# Patient Record
Sex: Male | Born: 1960
Health system: Southern US, Community
[De-identification: ages and names within clinical notes are randomized; demographics above are authoritative.]

## PROBLEM LIST (undated history)

## (undated) DIAGNOSIS — G4730 Sleep apnea, unspecified: Secondary | ICD-10-CM

## (undated) DIAGNOSIS — Z5189 Encounter for other specified aftercare: Secondary | ICD-10-CM

## (undated) DIAGNOSIS — H269 Unspecified cataract: Secondary | ICD-10-CM

## (undated) DIAGNOSIS — M1990 Unspecified osteoarthritis, unspecified site: Secondary | ICD-10-CM

## (undated) DIAGNOSIS — Z86010 Personal history of colonic polyps: Secondary | ICD-10-CM

## (undated) HISTORY — DX: Encounter for other specified aftercare: Z51.89

## (undated) HISTORY — DX: Sleep apnea, unspecified: G47.30

## (undated) HISTORY — DX: Personal history of colonic polyps: Z86.010

## (undated) HISTORY — DX: Unspecified osteoarthritis, unspecified site: M19.90

## (undated) HISTORY — PX: ANKLE FRACTURE SURGERY: SHX122

## (undated) HISTORY — PX: OTHER SURGICAL HISTORY: SHX169

## (undated) HISTORY — PX: CARPAL TUNNEL RELEASE: SHX101

## (undated) HISTORY — DX: Unspecified cataract: H26.9

## (undated) HISTORY — PX: COLONOSCOPY: SHX174

---

## 1999-04-20 ENCOUNTER — Inpatient Hospital Stay (HOSPITAL_COMMUNITY): Admission: EM | Admit: 1999-04-20 | Discharge: 1999-04-23 | Payer: Self-pay | Admitting: Emergency Medicine

## 1999-04-20 ENCOUNTER — Encounter: Payer: Self-pay | Admitting: Specialist

## 1999-04-20 ENCOUNTER — Encounter: Payer: Self-pay | Admitting: Emergency Medicine

## 1999-10-31 ENCOUNTER — Emergency Department (HOSPITAL_COMMUNITY): Admission: EM | Admit: 1999-10-31 | Discharge: 1999-10-31 | Payer: Self-pay | Admitting: Emergency Medicine

## 2000-03-24 ENCOUNTER — Emergency Department (HOSPITAL_COMMUNITY): Admission: EM | Admit: 2000-03-24 | Discharge: 2000-03-25 | Payer: Self-pay | Admitting: Emergency Medicine

## 2001-01-14 ENCOUNTER — Emergency Department (HOSPITAL_COMMUNITY): Admission: EM | Admit: 2001-01-14 | Discharge: 2001-01-14 | Payer: Self-pay | Admitting: Emergency Medicine

## 2001-01-27 ENCOUNTER — Ambulatory Visit (HOSPITAL_BASED_OUTPATIENT_CLINIC_OR_DEPARTMENT_OTHER): Admission: RE | Admit: 2001-01-27 | Discharge: 2001-01-27 | Payer: Self-pay | Admitting: Orthopedic Surgery

## 2003-06-13 ENCOUNTER — Emergency Department (HOSPITAL_COMMUNITY): Admission: EM | Admit: 2003-06-13 | Discharge: 2003-06-13 | Payer: Self-pay | Admitting: Emergency Medicine

## 2004-05-24 ENCOUNTER — Ambulatory Visit (HOSPITAL_BASED_OUTPATIENT_CLINIC_OR_DEPARTMENT_OTHER): Admission: RE | Admit: 2004-05-24 | Discharge: 2004-05-24 | Payer: Self-pay | Admitting: Otolaryngology

## 2004-10-27 ENCOUNTER — Ambulatory Visit: Payer: Self-pay | Admitting: Internal Medicine

## 2004-11-24 ENCOUNTER — Emergency Department (HOSPITAL_COMMUNITY): Admission: EM | Admit: 2004-11-24 | Discharge: 2004-11-25 | Payer: Self-pay | Admitting: Emergency Medicine

## 2005-10-12 ENCOUNTER — Emergency Department (HOSPITAL_COMMUNITY): Admission: EM | Admit: 2005-10-12 | Discharge: 2005-10-12 | Payer: Self-pay | Admitting: Emergency Medicine

## 2007-03-24 DIAGNOSIS — J45909 Unspecified asthma, uncomplicated: Secondary | ICD-10-CM | POA: Insufficient documentation

## 2007-03-24 DIAGNOSIS — M1990 Unspecified osteoarthritis, unspecified site: Secondary | ICD-10-CM | POA: Insufficient documentation

## 2007-03-24 DIAGNOSIS — F411 Generalized anxiety disorder: Secondary | ICD-10-CM | POA: Insufficient documentation

## 2009-01-01 ENCOUNTER — Ambulatory Visit: Payer: Self-pay | Admitting: Radiology

## 2009-01-01 ENCOUNTER — Emergency Department (HOSPITAL_BASED_OUTPATIENT_CLINIC_OR_DEPARTMENT_OTHER): Admission: EM | Admit: 2009-01-01 | Discharge: 2009-01-01 | Payer: Self-pay | Admitting: Emergency Medicine

## 2010-07-25 ENCOUNTER — Encounter (INDEPENDENT_AMBULATORY_CARE_PROVIDER_SITE_OTHER): Payer: Self-pay | Admitting: *Deleted

## 2010-07-26 ENCOUNTER — Encounter: Payer: Self-pay | Admitting: Internal Medicine

## 2010-08-03 NOTE — Letter (Signed)
Summary: [facility] Instructions  [facility]  [address] [place], [place] [address]   Phone: [phone]  Fax: [phone]       Calvin Lin    Jul 12, 1960    MRN: [id number]        Procedure Day Dorna Bloom: Wednesday, 09-13-10     Arrival Time:  10:30am     Procedure Time:  11:30am     Location of Procedure:                    _X _  [facility] (4th Floor)                        PREPARATION FOR COLONOSCOPY WITH MOVIPREP   Starting 5 days prior to your procedure 09-08-10  do not eat nuts, seeds, popcorn, corn, beans, peas,  salads, or any raw vegetables.  Do not take any fiber supplements (e.g. Metamucil, Citrucel, and Benefiber).  THE DAY BEFORE YOUR PROCEDURE         DATE: Tuesday, 09-12-10  1.  Drink clear liquids the entire day-NO SOLID FOOD  2.  Do not drink anything colored red or purple.  Avoid juices with pulp.  No orange juice.  3.  Drink at least 64 oz. (8 glasses) of fluid/clear liquids during the day to prevent dehydration and help the prep work efficiently.  CLEAR LIQUIDS INCLUDE: Water Jello Ice Popsicles Tea (sugar ok, no milk/cream) Powdered fruit flavored drinks Coffee (sugar ok, no milk/cream) Gatorade Juice: apple, white grape, white cranberry  Lemonade Clear bullion, consomm, broth Carbonated beverages (any kind) Strained chicken noodle soup Hard Candy                             4.  In the morning, mix first dose of MoviPrep solution:    Empty 1 Pouch A and 1 Pouch B into the disposable container    Add lukewarm drinking water to the top line of the container. Mix to dissolve    Refrigerate (mixed solution should be used within 24 hrs)  5.  Begin drinking the prep at 5:00 p.m. The MoviPrep container is divided by 4 marks.   Every 15 minutes drink the solution down to the next mark (approximately 8 oz) until the full liter is complete.   6.  Follow completed prep with 16 oz of clear liquid of your choice  (Nothing red or purple).  Continue to drink clear liquids until bedtime.  7.  Before going to bed, mix second dose of MoviPrep solution:    Empty 1 Pouch A and 1 Pouch B into the disposable container    Add lukewarm drinking water to the top line of the container. Mix to dissolve    Refrigerate  THE DAY OF YOUR PROCEDURE      DATE: Wednesday, 09-13-10  Beginning at 5:30 a.m. (5 hours before procedure):         1. Every 15 minutes, drink the solution down to the next mark (approx 8 oz) until the full liter is complete.  2. Follow completed prep with 16 oz. of clear liquid of your choice.    3. You may drink clear liquids until 9:30am (2 HOURS BEFORE PROCEDURE).   MEDICATION INSTRUCTIONS  Unless otherwise instructed, you should take regular prescription medications with a small sip of water   as early as possible the morning of your procedure.  OTHER INSTRUCTIONS  You will need a responsible adult at least 52 years of age to accompany you and drive you home.   This person must remain in the waiting room during your procedure.  Wear loose fitting clothing that is easily removed.  Leave jewelry and other valuables at home.  However, you may wish to bring a book to read or  an iPod/MP3 player to listen to music as you wait for your procedure to start.  Remove all body piercing jewelry and leave at home.  Total time from sign-in until discharge is approximately 2-3 hours.  You should go home directly after your procedure and rest.  You can resume normal activities the  day after your procedure.  The day of your procedure you should not:   Drive   Make legal decisions   Operate machinery   Drink alcohol   Return to work  You will receive specific instructions about eating, activities and medications before you leave.    The above instructions have been reviewed and explained to me by   Karl Bales RN  July 26, 2010 9:26 AM    I fully  understand and can verbalize these instructions _____________________________ Date _________

## 2010-08-03 NOTE — Miscellaneous (Signed)
Summary: [facility] previsit  Clinical Lists Changes  Medications: Added new medication of MOVIPREP 100 GM  SOLR (PEG-KCL-NACL-NASULF-NA ASC-C) As per prep instructions. - Signed Rx of MOVIPREP 100 GM  SOLR (PEG-KCL-NACL-NASULF-NA ASC-C) As per prep instructions.;  #1 x 0;  Signed;  Entered by: Karl Bales RN;  Authorized by: Iva Boop MD, [facility];  Method used: Electronically to [id number]*, [address] [place], [place], [place]  [address], Ph: [phone], Fax: [phone] Observations: Added new observation of NKA: T (07/26/2010 8:53)    Prescriptions: MOVIPREP 100 GM  SOLR (PEG-KCL-NACL-NASULF-NA ASC-C) As per prep instructions.  #1 x 0   Entered by:   Karl Bales RN   Authorized by:   Iva Boop MD, [facility]   Signed by:   Karl Bales RN on 07/26/2010   Method used:   Electronically to        [facility] [phone]* (retail)       [phone]       [place], [place]  [address]       Ph: [phone]       Fax: [phone]   RxID:   [id number]

## 2010-08-09 ENCOUNTER — Other Ambulatory Visit: Payer: Self-pay | Admitting: Internal Medicine

## 2010-08-22 ENCOUNTER — Other Ambulatory Visit: Payer: Self-pay | Admitting: Internal Medicine

## 2010-09-12 ENCOUNTER — Encounter: Payer: Self-pay | Admitting: Internal Medicine

## 2010-09-13 ENCOUNTER — Ambulatory Visit (AMBULATORY_SURGERY_CENTER): Payer: 59 | Admitting: Internal Medicine

## 2010-09-13 ENCOUNTER — Encounter: Payer: Self-pay | Admitting: Internal Medicine

## 2010-09-13 VITALS — BP 116/70 | HR 78 | Temp 98.1°F | Resp 16 | Ht 71.0 in | Wt 240.0 lb

## 2010-09-13 DIAGNOSIS — Z860100 Personal history of colon polyps, unspecified: Secondary | ICD-10-CM | POA: Insufficient documentation

## 2010-09-13 DIAGNOSIS — Z1211 Encounter for screening for malignant neoplasm of colon: Secondary | ICD-10-CM

## 2010-09-13 DIAGNOSIS — D126 Benign neoplasm of colon, unspecified: Secondary | ICD-10-CM

## 2010-09-13 HISTORY — DX: Personal history of colon polyps, unspecified: Z86.0100

## 2010-09-13 HISTORY — DX: Personal history of colonic polyps: Z86.010

## 2010-09-13 NOTE — Progress Notes (Signed)
Pressure applied to abdomen to reach cecum.  

## 2010-09-13 NOTE — Patient Instructions (Signed)
HANDOUT GIVEN ON POLYPS WITH DISCHARGE INSTRUCTIONS

## 2010-09-14 ENCOUNTER — Telehealth: Payer: Self-pay

## 2010-09-14 NOTE — Telephone Encounter (Signed)
Home # has been disiconnected.   Tried to reach pt. By cell #.  Voice mail but no ID.  No message left. MAW,RN

## 2010-09-18 ENCOUNTER — Encounter: Payer: Self-pay | Admitting: Internal Medicine

## 2010-09-18 NOTE — Progress Notes (Signed)
Quick Note:  3 adenomas (diminutive) Recall colon 3 years 08/2013 ______

## 2010-09-19 NOTE — Procedures (Signed)
Summary: Colonoscopy  Patient: Calvin Lin Note: All result statuses are Final unless otherwise noted.  Tests: (1) Colonoscopy (COL)   COL Colonoscopy           DONE     [facility]     [address]     [place], [place]  [address]          COLONOSCOPY PROCEDURE REPORT          PATIENT:  Calvin Lin, Calvin Lin  MR#:  [id number]     BIRTHDATE:  08-06-1960, 50 yrs. old  GENDER:  male     ENDOSCOPIST:  Iva Boop, MD, [facility]     REF. BY:  Lissa Merlin, M.D.     PROCEDURE DATE:  09/13/2010     PROCEDURE:  Colonoscopy with snare polypectomy     ASA CLASS:  Class I     INDICATIONS:  Routine Risk Screening     MEDICATIONS:   Fentanyl 75 mcg IV, Versed 7 mg IV          DESCRIPTION OF PROCEDURE:   After the risks benefits and     alternatives of the procedure were thoroughly explained, informed     consent was obtained.  Digital rectal exam was performed and     revealed no abnormalities and normal prostate.   The LB160 [id number]     endoscope was introduced through the anus and advanced to the     cecum, which was identified by both the appendix and ileocecal     valve, without limitations.  The quality of the prep was good,     using MoviPrep.  The instrument was then slowly withdrawn as the     colon was fully examined. Insertion: 3:13 minutes withdrawal:     12:57 minutes     <<PROCEDUREIMAGES>>          FINDINGS:  Three polyps were found. IC valve (4-5 mm), transverse     (5mm) and sigmoid (3mm). Polyps were snared without cautery.     Retrieval was successful.  This was otherwise a normal examination     of the colon.   Retroflexed views in the rectum revealed no     abnormalities.    The scope was then withdrawn from the patient     and the procedure completed.          COMPLICATIONS:  None     ENDOSCOPIC IMPRESSION:     1) Three polyps removed, largest 5mm     2) Otherwise normal examination, good prep          REPEAT EXAM:  In for Colonoscopy, pending  biopsy results.          Iva Boop, MD, [facility]          CC:  Lissa Merlin, MD ([facility]) and The Patient          n.     eSIGNED:   Iva Boop at 09/13/2010 12:02 PM          [place], [phone]  Note: An exclamation mark (!) indicates a result that was not dispersed into the flowsheet. Document Creation Date: 09/13/2010 12:02 PM _______________________________________________________________________  (1) Order result status: Final Collection or observation date-time: 09/13/2010 11:54 Requested date-time:  Receipt date-time:  Reported date-time:  Referring Physician:   Ordering Physician: Stan Head [phone]) Specimen Source:  Source: Launa Grill Order Number: [id number] Lab site:

## 2010-09-24 LAB — DIFFERENTIAL
Basophils Absolute: 0.1 10*3/uL (ref 0.0–0.1)
Basophils Relative: 1 % (ref 0–1)
Eosinophils Absolute: 0.2 10*3/uL (ref 0.0–0.7)
Eosinophils Relative: 4 % (ref 0–5)
Lymphocytes Relative: 32 % (ref 12–46)
Lymphs Abs: 1.9 10*3/uL (ref 0.7–4.0)
Monocytes Absolute: 0.5 10*3/uL (ref 0.1–1.0)
Monocytes Relative: 9 % (ref 3–12)
Neutro Abs: 3.2 10*3/uL (ref 1.7–7.7)
Neutrophils Relative %: 55 % (ref 43–77)

## 2010-09-24 LAB — BASIC METABOLIC PANEL
BUN: 13 mg/dL (ref 6–23)
CO2: 28 mEq/L (ref 19–32)
Calcium: 9.3 mg/dL (ref 8.4–10.5)
Chloride: 104 mEq/L (ref 96–112)
Creatinine, Ser: 0.9 mg/dL (ref 0.4–1.5)
GFR calc Af Amer: >60 mL/min (ref >60)
GFR calc non Af Amer: >60 mL/min (ref >60)
Glucose, Bld: 88 mg/dL (ref 70–99)
Potassium: 4.3 mEq/L (ref 3.5–5.1)
Sodium: 142 mEq/L (ref 135–145)

## 2010-09-24 LAB — CBC
HCT: 40.2 % (ref 39.0–52.0)
Hemoglobin: 13.9 g/dL (ref 13.0–17.0)
MCHC: 34.5 g/dL (ref 30.0–36.0)
MCV: 88.6 fL (ref 78.0–100.0)
Platelets: 217 10*3/uL (ref 150–400)
RBC: 4.54 MIL/uL (ref 4.22–5.81)
RDW: 12.2 % (ref 11.5–15.5)
WBC: 5.9 10*3/uL (ref 4.0–10.5)

## 2010-09-24 LAB — POCT CARDIAC MARKERS
CKMB, poc: 3.4 ng/mL (ref 1.0–8.0)
Myoglobin, poc: 66.5 ng/mL (ref 12–200)
Troponin i, poc: <0.05 ng/mL (ref 0.00–0.09)

## 2010-11-03 NOTE — Procedures (Signed)
Calvin Lin, Calvin Lin              ACCOUNT NO.:  [id number]   MEDICAL RECORD NO.:  [id number]          PATIENT TYPE:  OUT   LOCATION:  SLEEP CENTER                 FACILITY:  [facility]   PHYSICIAN:  Clinton D. Maple Hudson, M.D. DATE OF BIRTH:  May 19, 1961   DATE OF STUDY:  05/24/2004                              NOCTURNAL POLYSOMNOGRAM   STUDY DATE:  05/24/04   REFERRING PHYSICIAN:  Cristal Deer E. Ezzard Standing, M.D.   INDICATION FOR STUDY:  Hypersomnia with sleep apnea.   EPWORTH SLEEPINESS SCORE:  14/24   NECK SIZE:  17 inches   BMI:  32.6   WEIGHT:  228 pounds   SLEEP ARCHITECTURE:  Total sleep time 396 minutes with sleep efficiency 87%.  Stage I was 3%, stage II 75%, stages III and IV 6%.  REM was 17% of total  sleep time.  Sleep latency 5 minutes.  REM latency 127 minutes.  Awake after  sleep onset 53 minutes.  Arousal index 16.   RESPIRATORY DATA:  Split study protocol.  RDI 21.2 per hour indicating  moderate obstructive sleep apnea/hypopnea syndrome before CPAP.  This  included 1 obstructive apnea and 55 hypopnea's before CPAP.  Most events and  most sleep were while supine.  REM RDI 17.4 per hour.  CPAP was titrated to  11 CWP, RDI 0 per hour.  A large ResMed full face mask was used with heated  humerus.   OXYGEN DATA:  Moderate to loud snoring with oxygen desaturation to a nadir  of 85% before CPAP.  After CPAP control, saturation held 95% to 96% on room  air.   CARDIAC DATA:  Normal cardiac rhythm.   MOVEMENTS/PARASOMNIA:  A total of 169 limb jerks were recorded of which 9  were associated with arousal or awakening for a periodic limb movement with  arousal index of 1.4 per hour which is probably not significant.   IMPRESSION/RECOMMENDATION:  Moderately severe obstructive sleep  apnea/hypopnea syndrome, RDI 21.2 per hour with desaturation to 85%.  CPAP  was titrated to 11 CWP, RDI 0 per hour using a large ResMed full face mask  with heated humidifier.                                        Clinton D. Maple Hudson, M.D.  Diplomate, [employer] Board   CDY/MEDQ  D:  05/28/2004 12:26:05  T:  05/28/2004 19:26:37  Job:  [id number]

## 2010-11-03 NOTE — Op Note (Signed)
[place]  Patient:    Calvin, Lin                       MRN: [id number] Proc. Date: 01/27/01 Adm. 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  OPERATION/PROCEDURE:  1. Right ankle examination under anesthesia followed by arthroscopic     debridement.  2. Partial synovectomy.  3. Loose body excision.  SURGEON: Elana Alm. Thurston Hole, M.D.  ASSISTANT: Julien Girt, P.A.  ANESTHESIA: General.  OPERATIVE TIME: Forty-five minutes.  COMPLICATIONS: None.  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  Of note, he had a previous ankle fracture, fixed by Dr. Valma Cava in 2000, and had done fairly well until this most recent injury in April 2002.  X-ray examination and MRI have revealed loose body calcification in the medial deltoid ligament and chondromalacia, and possible partial nonunion versus delayed union of his posterior malleolus, although all of his pain is anterior and anteromedial.  DESCRIPTION OF PROCEDURE: Calvin Lin was brought to the operating room on January 27, 2001 and placed on the operating room table in the supine position. After an adequate level of general anesthesia was obtained his right ankle was examined under anesthesia.  Range of motion, dorsiflexion 5 degrees, plantar flexion 20 degrees.  The ankle was stable to ligamentous examination.  An anterolateral portal and anteromedial portal were made carefully, incising only the skin, and carefully dissecting down to the joint to prevent any injury to the dorsal  cutaneous nerves.  The arthroscope with a pump attached with placed in the anteromedial portal and an arthroscopic probe placed through the anterolateral portal.  On initial inspection grade 3 chondromalacia was noted over 25-30% of the talar dome and tibial plafond, which was thoroughly debrided.  Significant synovitis and scarring anterolaterally was thoroughly debrided.  The lateral malleolus showed grade 2 and 25-30% grade 3 chondromalacia, which was debrided from the lateral talus. Articulating with this showed mild grade 1-2 changes.  The lateral gutter was debrided of synovitis but no other pathology was noted.  Medially moderate synovitis was debrided.  The medial gutters showed a partially loose calcific piece in the deep portion of the medial deltoid ligament, which was teased out and removed in pieces.  This was removed both arthroscopically and confirmed fluoroscopically.  The articular surface on the medial malleolus showed grade 2 and 25-30% grade 3 chondromalacia, which was debrided.  The medial talar dome had the same findings and this was debrided.  After this was done, fluoroscopically there was still a hint of some calcification in the mid portion of the deltoid ligament; however, it was felt that this faint remainder of calcium did not need to be removed due to the fact that it would probably cause more damage to the deltoid ligament removing it in that the main portions of the medial calcification had been thoroughly removed, which was causing his pain.  After this was done, no other pathology was noted.  At this point it was  felt that all pathology had been satisfactorily addressed. The instruments were removed.  Portals were closed with 3-0 nylon suture and injected with 0.25% Marcaine with epinephrine, sterile dressings were applied, and the tourniquet was released.  The patient was awakened and taken to the recovery room in stable condition.  FOLLOW-UP CARE: [name] will be followed as an outpatient, on Vicodin and Naprosyn, and is to see me back in the office in a week for sutures and follow-up. DD:  01/27/01 TD:  01/27/01 Job: [id number]

## 2010-12-08 ENCOUNTER — Emergency Department (HOSPITAL_COMMUNITY): Payer: 59

## 2010-12-08 ENCOUNTER — Emergency Department (HOSPITAL_COMMUNITY)
Admission: EM | Admit: 2010-12-08 | Discharge: 2010-12-08 | Disposition: A | Discharge status: Home / Self Care | Payer: 59 | Attending: Emergency Medicine | Admitting: Emergency Medicine

## 2010-12-08 DIAGNOSIS — W268XXA Contact with other sharp object(s), not elsewhere classified, initial encounter: Secondary | ICD-10-CM | POA: Insufficient documentation

## 2010-12-08 DIAGNOSIS — M79609 Pain in unspecified limb: Secondary | ICD-10-CM | POA: Insufficient documentation

## 2010-12-08 DIAGNOSIS — Y93H2 Activity, gardening and landscaping: Secondary | ICD-10-CM | POA: Insufficient documentation

## 2010-12-08 DIAGNOSIS — S61209A Unspecified open wound of unspecified finger without damage to nail, initial encounter: Secondary | ICD-10-CM | POA: Insufficient documentation

## 2011-10-29 ENCOUNTER — Encounter (HOSPITAL_COMMUNITY): Payer: Self-pay

## 2011-10-29 ENCOUNTER — Emergency Department (HOSPITAL_COMMUNITY): Payer: 59

## 2011-10-29 ENCOUNTER — Emergency Department (HOSPITAL_COMMUNITY)
Admission: EM | Admit: 2011-10-29 | Discharge: 2011-10-29 | Disposition: A | Discharge status: Home / Self Care | Payer: 59 | Attending: Emergency Medicine | Admitting: Emergency Medicine

## 2011-10-29 DIAGNOSIS — M542 Cervicalgia: Secondary | ICD-10-CM | POA: Insufficient documentation

## 2011-10-29 DIAGNOSIS — M25539 Pain in unspecified wrist: Secondary | ICD-10-CM | POA: Insufficient documentation

## 2011-10-29 DIAGNOSIS — S060X9A Concussion with loss of consciousness of unspecified duration, initial encounter: Secondary | ICD-10-CM | POA: Insufficient documentation

## 2011-10-29 DIAGNOSIS — G4730 Sleep apnea, unspecified: Secondary | ICD-10-CM | POA: Insufficient documentation

## 2011-10-29 DIAGNOSIS — Z8739 Personal history of other diseases of the musculoskeletal system and connective tissue: Secondary | ICD-10-CM | POA: Insufficient documentation

## 2011-10-29 DIAGNOSIS — J45909 Unspecified asthma, uncomplicated: Secondary | ICD-10-CM | POA: Insufficient documentation

## 2011-10-29 DIAGNOSIS — S060XAA Concussion with loss of consciousness status unknown, initial encounter: Secondary | ICD-10-CM

## 2011-10-29 DIAGNOSIS — S63509A Unspecified sprain of unspecified wrist, initial encounter: Secondary | ICD-10-CM

## 2011-10-29 MED ORDER — MORPHINE SULFATE 4 MG/ML IJ SOLN
4.0000 mg | Freq: Once | INTRAMUSCULAR | Status: AC
  Administered 2011-10-29: 4 mg via INTRAMUSCULAR
  Filled 2011-10-29: qty 4

## 2011-10-29 MED ORDER — METHOCARBAMOL 500 MG PO TABS: 500.0000 mg | ORAL_TABLET | Freq: Two times a day (BID) | ORAL | Status: AC

## 2011-10-29 MED ORDER — ONDANSETRON 4 MG PO TBDP
4.0000 mg | ORAL_TABLET | Freq: Once | ORAL | Status: AC
  Administered 2011-10-29: 4 mg via ORAL
  Filled 2011-10-29: qty 1

## 2011-10-29 MED ORDER — ONDANSETRON HCL 4 MG PO TABS: 4.0000 mg | ORAL_TABLET | Freq: Four times a day (QID) | ORAL | Status: AC

## 2011-10-29 MED ORDER — HYDROCODONE-ACETAMINOPHEN 5-325 MG PO TABS: 1.0000 | ORAL_TABLET | ORAL | Status: AC | PRN

## 2011-10-29 MED ORDER — IBUPROFEN 600 MG PO TABS: 600.0000 mg | ORAL_TABLET | Freq: Four times a day (QID) | ORAL | Status: AC | PRN

## 2011-10-29 NOTE — Discharge Instructions (Signed)
Concussion Direct trauma to the head often causes a condition known as a concussion. This injury will interfere with brain function and may cause you to lose consciousness. The consequences of a concussion are usually temporary, but repetitive concussions can be very dangerous. If you have multiple concussions, you will have a greater risk of long-term effects, such as slurred speech, slow movements, impaired thinking, or tremors. The severity of a concussion is based on the length and severity of the interference with brain activity. SYMPTOMS  Symptoms of a concussion vary depending on the severity of the injury. Very mild concussions may even occur without any noticeable symptoms. Swelling in the area of the injury is not related to the seriousness of the injury.   Mild concussion:   Temporary loss of consciousness.   Memory loss (amnesia) for a short time.   Emotional instability.   Confusion.   Severe concussion:   Usually prolonged loss of consciousness.   One pupil (the black part in the middle of the eye) is larger than the other.   Changes in vision (including blurring).   Changes in breathing.   Disturbed balance (equilibrium).   Headaches.   Confusion.   Nausea or vomiting.  CAUSES  A concussion is the result of trauma to the head. When the head is subjected to such an injury, the brain strikes against the inner wall of the skull. This impact is what causes the damage to the brain. The force of injury is related to severity of injury. The most severe concussions are associated with incidents that involve large impact forces such as motor vehicle accidents. Wearing a helmet will reduce the severity of trauma to the head, but concussions may still occur if you are wearing a helmet. RISK INCREASES WITH:  Contact sports (football, hockey, rugby, or lacrosse).   Fighting sports (martial arts or boxing).   Riding bicycles, motorcycles, or horses (when you ride without a  helmet).  PREVENTION  Wear proper protective headgear and ensure correct fit.   Wear seat belts when driving and riding in a car.   Do not drink or use mind-altering drugs and drive.  PROGNOSIS  Concussions are typically curable if they are recognized and treated early. If a severe concussion or multiple concussions go untreated, then the complications may be life-threatening or cause permanent disability and brain damage. RELATED COMPLICATIONS   Permanent brain damage (slurred speech, slow movement, impaired thinking, or tremors).   Bleeding under the skull (subdural hemorrhage or hematoma, epidural hematoma).   Bleeding into the brain.   Prolonged healing time if usual activities are resumed too soon.   Infection if skin over the concussion site is broken.   Increased risk of future concussions (less trauma is required for a second concussion than the first).  TREATMENT  Treatment initially requires immediate evaluation to determine the severity of the concussion. Occasionally, a hospital stay may be required for observation and treatment.  Avoid exertion. Bed rest for the first 24 to 48 hours is recommended.  Return to play is a controversial subject due to the increased risk for future injury as well as permanent disability and should be discussed at length with your treating caregiver. Many factors such as the severity of the concussion and whether this is the first, second, or third concussion play a role in timing a patient's return to sports.  MEDICATION  Do not give any medicine, including non-prescription acetaminophen or aspirin, until the diagnosis is certain. These medicines may mask   developing symptoms.  SEEK IMMEDIATE MEDICAL CARE IF:   Symptoms get worse or do not improve in 24 hours.   Any of the following symptoms occur:   Vomiting.   The inability to move arms and legs equally well on both sides.   Fever.   Neck stiffness.   Pupils of unequal size, shape,  or reactivity.   Convulsions.   Noticeable restlessness.   Severe headache that persists for longer than 4 hours after injury.   Confusion, disorientation, or mental status changes.  Document Released: 06/04/2005 Document Revised: 05/24/2011 Document Reviewed: 09/16/2008 Exit[employer] Patient Information 2012 [employer]. 
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
## 2012-04-21 ENCOUNTER — Encounter (HOSPITAL_COMMUNITY): Payer: Self-pay | Admitting: *Deleted

## 2012-04-21 ENCOUNTER — Emergency Department (HOSPITAL_COMMUNITY)
Admission: EM | Admit: 2012-04-21 | Discharge: 2012-04-21 | Disposition: A | Discharge status: Home / Self Care | Payer: Worker's Compensation | Attending: Emergency Medicine | Admitting: Emergency Medicine

## 2012-04-21 DIAGNOSIS — Y9289 Other specified places as the place of occurrence of the external cause: Secondary | ICD-10-CM | POA: Insufficient documentation

## 2012-04-21 DIAGNOSIS — Y9389 Activity, other specified: Secondary | ICD-10-CM | POA: Insufficient documentation

## 2012-04-21 DIAGNOSIS — Z8739 Personal history of other diseases of the musculoskeletal system and connective tissue: Secondary | ICD-10-CM | POA: Insufficient documentation

## 2012-04-21 DIAGNOSIS — T65891A Toxic effect of other specified substances, accidental (unintentional), initial encounter: Secondary | ICD-10-CM | POA: Insufficient documentation

## 2012-04-21 DIAGNOSIS — T5994XA Toxic effect of unspecified gases, fumes and vapors, undetermined, initial encounter: Secondary | ICD-10-CM

## 2012-04-21 DIAGNOSIS — J45909 Unspecified asthma, uncomplicated: Secondary | ICD-10-CM | POA: Insufficient documentation

## 2012-04-21 DIAGNOSIS — G4730 Sleep apnea, unspecified: Secondary | ICD-10-CM | POA: Insufficient documentation
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
## 2012-04-24 NOTE — ED Provider Notes (Signed)
Medical screening examination/treatment/procedure(s) were performed by non-physician practitioner and as supervising physician I was immediately available for consultation/collaboration.   David H Yao, MD 04/24/12 0657 

## 2013-08-19 ENCOUNTER — Encounter: Payer: Self-pay | Admitting: Internal Medicine

## 2013-09-16 ENCOUNTER — Encounter: Payer: Self-pay | Admitting: Internal Medicine

## 2013-10-12 ENCOUNTER — Encounter (HOSPITAL_COMMUNITY): Payer: Self-pay | Admitting: Emergency Medicine

## 2013-10-12 ENCOUNTER — Emergency Department (HOSPITAL_COMMUNITY): Payer: Worker's Compensation

## 2013-10-12 ENCOUNTER — Emergency Department (HOSPITAL_COMMUNITY)
Admission: EM | Admit: 2013-10-12 | Discharge: 2013-10-12 | Disposition: A | Discharge status: Home / Self Care | Payer: Worker's Compensation | Attending: Emergency Medicine | Admitting: Emergency Medicine

## 2013-10-12 DIAGNOSIS — S59909A Unspecified injury of unspecified elbow, initial encounter: Secondary | ICD-10-CM | POA: Diagnosis present

## 2013-10-12 DIAGNOSIS — Z8739 Personal history of other diseases of the musculoskeletal system and connective tissue: Secondary | ICD-10-CM | POA: Diagnosis not present

## 2013-10-12 DIAGNOSIS — IMO0002 Reserved for concepts with insufficient information to code with codable children: Secondary | ICD-10-CM | POA: Diagnosis not present

## 2013-10-12 DIAGNOSIS — S59919A Unspecified injury of unspecified forearm, initial encounter: Secondary | ICD-10-CM | POA: Diagnosis present

## 2013-10-12 DIAGNOSIS — S59911A Unspecified injury of right forearm, initial encounter: Secondary | ICD-10-CM

## 2013-10-12 DIAGNOSIS — Y9389 Activity, other specified: Secondary | ICD-10-CM | POA: Diagnosis not present

## 2013-10-12 DIAGNOSIS — J45909 Unspecified asthma, uncomplicated: Secondary | ICD-10-CM | POA: Insufficient documentation

## 2013-10-12 DIAGNOSIS — Y990 Civilian activity done for income or pay: Secondary | ICD-10-CM | POA: Insufficient documentation

## 2013-10-12 DIAGNOSIS — Y9289 Other specified places as the place of occurrence of the external cause: Secondary | ICD-10-CM | POA: Diagnosis not present

## 2013-10-12 DIAGNOSIS — S6990XA Unspecified injury of unspecified wrist, hand and finger(s), initial encounter: Principal | ICD-10-CM

## 2013-10-12 DIAGNOSIS — Z79899 Other long term (current) drug therapy: Secondary | ICD-10-CM | POA: Insufficient documentation

## 2013-10-12 MED ORDER — IBUPROFEN 800 MG PO TABS
800.0000 mg | ORAL_TABLET | Freq: Once | ORAL | Status: AC
  Administered 2013-10-12: 800 mg via ORAL
  Filled 2013-10-12: qty 1

## 2013-10-12 MED ORDER — IBUPROFEN 800 MG PO TABS: 800.0000 mg | ORAL_TABLET | Freq: Three times a day (TID) | ORAL | Status: DC

## 2013-10-12 NOTE — Discharge Instructions (Signed)
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
## 2013-10-13 NOTE — ED Provider Notes (Signed)
Medical screening examination/treatment/procedure(s) were performed by non-physician practitioner and as supervising physician I was immediately available for consultation/collaboration.   EKG Interpretation None        Delice Bison Saadia Dewitt, DO 10/13/13 0025

## 2013-10-15 ENCOUNTER — Ambulatory Visit (AMBULATORY_SURGERY_CENTER): Payer: Self-pay

## 2013-10-15 VITALS — Ht 71.0 in | Wt 244.0 lb

## 2013-10-15 DIAGNOSIS — Z860100 Personal history of colon polyps, unspecified: Secondary | ICD-10-CM

## 2013-10-15 MED ORDER — SUPREP BOWEL PREP KIT 17.5-3.13-1.6 GM/177ML PO SOLN: 1.0000 | Freq: Once | ORAL | Status: DC

## 2013-10-15 NOTE — Progress Notes (Signed)
No allergies to eggs or soy No home oxygen No diet/weight loss meds Has email.  Emmi instructions given for colonoscopy. "Had hard time waking up" after ankle surgery

## 2013-10-18 ENCOUNTER — Encounter (HOSPITAL_COMMUNITY): Payer: Self-pay | Admitting: Emergency Medicine

## 2013-10-18 ENCOUNTER — Emergency Department (HOSPITAL_COMMUNITY)
Admission: EM | Admit: 2013-10-18 | Discharge: 2013-10-18 | Disposition: A | Discharge status: Home / Self Care | Payer: 59 | Source: Home / Self Care | Attending: Family Medicine | Admitting: Family Medicine

## 2013-10-18 DIAGNOSIS — L247 Irritant contact dermatitis due to plants, except food: Secondary | ICD-10-CM

## 2013-10-18 DIAGNOSIS — L255 Unspecified contact dermatitis due to plants, except food: Secondary | ICD-10-CM

## 2013-10-18 MED ORDER — HYDROXYZINE HCL 25 MG PO TABS: 25.0000 mg | ORAL_TABLET | Freq: Three times a day (TID) | ORAL | Status: DC | PRN

## 2013-10-18 MED ORDER — PREDNISONE 10 MG PO TABS: ORAL_TABLET | ORAL | Status: DC

## 2013-10-18 NOTE — Discharge Instructions (Signed)

## 2013-10-18 NOTE — ED Provider Notes (Signed)
Medical screening examination/treatment/procedure(s) were performed by a resident physician or non-physician practitioner and as the supervising physician I was immediately available for consultation/collaboration.  Lynne Leader, MD    Gregor Hams, MD 10/18/13 [phone]

## 2013-10-18 NOTE — ED Provider Notes (Signed)
CSN: [id number]     Arrival date & time 10/18/13  1725 History   First MD Initiated Contact with Patient 10/18/13 1752     Chief Complaint  Patient presents with  . Rash   (Consider location/radiation/quality/duration/timing/severity/associated sxs/prior Treatment) HPI Comments: Patient reports that he was helping a neighbor clear some brush from her yard 3 days ago and has had pruritic rash on both of his forearms for the past two days. Presents for evaluation. No fever.   The history is provided by the patient.    Past Medical History  Diagnosis Date  . Sleep apnea   . Asthma   . Arthritis     r ankle   Past Surgical History  Procedure Laterality Date  . Vein repair r flank    . Carpal tunnel release      right  . R broken ankle repair     Family History  Problem Relation Age of Onset  . Colon cancer Father    History  Substance Use Topics  . Smoking status: Never Smoker   . Smokeless tobacco: Never Used  . Alcohol Use: 0.6 oz/week    1 Cans of beer per week    Review of Systems  All other systems reviewed and are negative.   Allergies  Review of patient's allergies indicates no known allergies.  Home Medications   Prior to Admission medications   Medication Sig Start Date End Date Taking? Authorizing Provider  ibuprofen (ADVIL,MOTRIN) 800 MG tablet Take 1 tablet (800 mg total) by mouth 3 (three) times daily. 10/12/13   Domenic Moras, PA-C  SUPREP BOWEL PREP SOLN Take 1 kit by mouth once. 10/15/13   Gatha Mayer, MD  vitamin C (ASCORBIC ACID) 500 MG tablet Take 500 mg by mouth daily.    Historical Provider, MD   BP 130/72  Pulse 78  Temp(Src) 98.6 F (37 C) (Oral)  Resp 18  SpO2 100% Physical Exam  Nursing note and vitals reviewed. Constitutional: He is oriented to person, place, and time. He appears well-developed and well-nourished.  HENT:  Head: Normocephalic and atraumatic.  Mouth/Throat: Oropharynx is clear and moist.  No mucous membrane lesions   Eyes: Conjunctivae are normal. No scleral icterus.  Cardiovascular: Normal rate.   Pulmonary/Chest: Effort normal.  Musculoskeletal: Normal range of motion.  Neurological: He is alert and oriented to person, place, and time.  Skin: Skin is warm and dry. Rash noted.  Erythematous papular rash on bilateral medial forearms   Psychiatric: His behavior is normal.    ED Course  Procedures (including critical care time) Labs Review Labs Reviewed - No data to display  Imaging Review No results found.   MDM   1. Contact dermatitis and eczema due to plant    Atarax and prednisone as prescribed with PCP follow up if no improvement.     [place], [place] 10/18/13 [phone]

## 2013-10-18 NOTE — ED Notes (Signed)
Pt  Has  A  Rash  On  Both  Arms  That  Itch     He  Reports  Was  Outside    Working  In  [employer]        sev  Days  Ago  Appears  In no  Distress   Speaking  In  Complete  sentances             Pt  Thinks  It  Might  Be  A    Poison  [place]vy  Type  Event

## 2013-10-22 ENCOUNTER — Encounter: Payer: Self-pay | Admitting: Internal Medicine

## 2013-10-29 ENCOUNTER — Encounter: Payer: Self-pay | Admitting: Internal Medicine

## 2013-10-29 ENCOUNTER — Ambulatory Visit (AMBULATORY_SURGERY_CENTER): Payer: 59 | Admitting: Internal Medicine

## 2013-10-29 VITALS — BP 119/69 | HR 58 | Temp 97.8°F | Resp 9 | Ht 71.0 in | Wt 244.0 lb

## 2013-10-29 DIAGNOSIS — Z86010 Personal history of colonic polyps: Secondary | ICD-10-CM

## 2013-10-29 DIAGNOSIS — D126 Benign neoplasm of colon, unspecified: Secondary | ICD-10-CM

## 2013-10-29 MED ORDER — SODIUM CHLORIDE 0.9 % IV SOLN: 500.0000 mL | INTRAVENOUS | Status: DC

## 2013-10-29 NOTE — Patient Instructions (Addendum)
I found and removed 1 tiny polyp that looks benign.  I will let you know pathology results and when to have another routine colonoscopy by mail.  I appreciate the opportunity to care for you. Gatha Mayer, MD, [facility]  Discharge instructions given with verbal understanding. Handout on polyps. Resume previous medications. YOU HAD AN ENDOSCOPIC PROCEDURE TODAY AT [facility]: Refer to the procedure report that was given to you for any specific questions about what was found during the examination.  If the procedure report does not answer your questions, please call your gastroenterologist to clarify.  If you requested that your care partner not be given the details of your procedure findings, then the procedure report has been included in a sealed envelope for you to review at your convenience later.  YOU SHOULD EXPECT: Some feelings of bloating in the abdomen. Passage of more gas than usual.  Walking can help get rid of the air that was put into your GI tract during the procedure and reduce the bloating. If you had a lower endoscopy (such as a colonoscopy or flexible sigmoidoscopy) you may notice spotting of blood in your stool or on the toilet paper. If you underwent a bowel prep for your procedure, then you may not have a normal bowel movement for a few days.  DIET: Your first meal following the procedure should be a light meal and then it is ok to progress to your normal diet.  A half-sandwich or bowl of soup is an example of a good first meal.  Heavy or fried foods are harder to digest and may make you feel nauseous or bloated.  Likewise meals heavy in dairy and vegetables can cause extra gas to form and this can also increase the bloating.  Drink plenty of fluids but you should avoid alcoholic beverages for 24 hours.  ACTIVITY: Your care partner should take you home directly after the procedure.  You should plan to take it easy, moving slowly for the rest of the day.  You can  resume normal activity the day after the procedure however you should NOT DRIVE or use heavy machinery for 24 hours (because of the sedation medicines used during the test).    SYMPTOMS TO REPORT IMMEDIATELY: A gastroenterologist can be reached at any hour.  During normal business hours, 8:30 AM to 5:00 PM Monday through Friday, call [phone].  After hours and on weekends, please call the GI answering service at [phone] who will take a message and have the physician on call contact you.   Following lower endoscopy (colonoscopy or flexible sigmoidoscopy):  Excessive amounts of blood in the stool  Significant tenderness or worsening of abdominal pains  Swelling of the abdomen that is new, acute  Fever of 100F or higher   FOLLOW UP: If any biopsies were taken you will be contacted by phone or by letter within the next 1-3 weeks.  Call your gastroenterologist if you have not heard about the biopsies in 3 weeks.  Our staff will call the home number listed on your records the next business day following your procedure to check on you and address any questions or concerns that you may have at that time regarding the information given to you following your procedure. This is a courtesy call and so if there is no answer at the home number and we have not heard from you through the emergency physician on call, we will assume that you have returned to your  regular daily activities without incident.  SIGNATURES/CONFIDENTIALITY: You and/or your care partner have signed paperwork which will be entered into your electronic medical record.  These signatures attest to the fact that that the information above on your After Visit Summary has been reviewed and is understood.  Full responsibility of the confidentiality of this discharge information lies with you and/or your care-partner.

## 2013-10-29 NOTE — Op Note (Signed)
[place]  [address] [employer] [address] [place], [address]   COLONOSCOPY PROCEDURE REPORT  PATIENT: Calvin Lin, Calvin Lin  MR#: [id number] BIRTHDATE: [birth year] , 16  yrs. old GENDER: Male ENDOSCOPIST: Gatha Mayer, MD, [facility] PROCEDURE DATE:  10/29/2013 PROCEDURE:   Colonoscopy with snare polypectomy First Screening Colonoscopy - Avg.  risk and is 50 yrs.  old or older - No.  Prior Negative Screening - Now for repeat screening. N/A  History of Adenoma - Now for follow-up colonoscopy & has been > or = to 3 yrs.  Yes hx of adenoma.  Has been 3 or more years since last colonoscopy.  Polyps Removed Today? Yes. ASA CLASS:   Class II INDICATIONS:Patient's personal history of adenomatous colon polyps.  MEDICATIONS: propofol (Diprivan) 200mg  IV, MAC sedation, administered by CRNA, and These medications were titrated to patient response per physician's verbal order  DESCRIPTION OF PROCEDURE:   After the risks benefits and alternatives of the procedure were thoroughly explained, informed consent was obtained.  A digital rectal exam revealed no abnormalities of the rectum, A digital rectal exam revealed no prostatic nodules, and A digital rectal exam revealed the prostate was not enlarged.   The LB [id number]  endoscope was introduced through the anus and advanced to the cecum, which was identified by both the appendix and ileocecal valve. No adverse events experienced.   The quality of the prep was excellent using Suprep  The instrument was then slowly withdrawn as the colon was fully examined.  COLON FINDINGS: A sessile polyp measuring 4 mm in size was found at the ileocecal valve.  A polypectomy was performed with a cold snare.  The resection was complete and the polyp tissue was completely retrieved.   The colon mucosa was otherwise normal.   A right colon retroflexion was performed.  Retroflexed views revealed no abnormalities. The time to cecum=2 minutes 06  seconds. Withdrawal time=10 minutes 16 seconds.  The scope was withdrawn and the procedure completed. COMPLICATIONS: There were no complications.  ENDOSCOPIC IMPRESSION: 1.   Sessile polyp measuring 4 mm in size was found at the ileocecal valve; polypectomy was performed with a cold snare 2.   The colon mucosa was otherwise normal - excellent prep - hx 3 diminutive adenomas in 2012  RECOMMENDATIONS: Timing of repeat colonoscopy will be determined by pathology findings.   eSigned:  Gatha Mayer, MD, [facility] 10/29/2013 11:17 AM   cc: The Patient

## 2013-10-29 NOTE — Progress Notes (Signed)
A/ox3 pleased with MAC, report to Celia RN 

## 2013-10-29 NOTE — Progress Notes (Signed)
Called to room to assist during endoscopic procedure.  Patient ID and intended procedure confirmed with present staff. Received instructions for my participation in the procedure from the performing physician.  

## 2013-10-30 ENCOUNTER — Telehealth: Payer: Self-pay | Admitting: *Deleted

## 2013-10-30 NOTE — Telephone Encounter (Signed)
  Follow up Call-  Call back number 10/29/2013  Post procedure Call Back phone  # [phone]  Permission to leave phone message Yes     Patient questions:  Do you have a fever, pain , or abdominal swelling? no Pain Score  0 *  Have you tolerated food without any problems? yes  Have you been able to return to your normal activities? yes  Do you have any questions about your discharge instructions: Diet   no Medications  no Follow up visit  no  Do you have questions or concerns about your Care? no  Actions: * If pain score is 4 or above: No action needed, pain <4.

## 2013-11-05 ENCOUNTER — Encounter: Payer: Self-pay | Admitting: Internal Medicine

## 2013-11-05 NOTE — Progress Notes (Signed)
Quick Note:  Diminutive adenoma - repeat colonoscopy 2020 ______ 

## 2014-06-21 IMAGING — CR DG FOREARM 2V*R*
2 series · 2 of 2 positions shown · non-contrast
Comparison: None.

CLINICAL DATA: Forearm pain status post trauma

EXAM:
RIGHT FOREARM - 2 VIEW

[x forearm ap right]
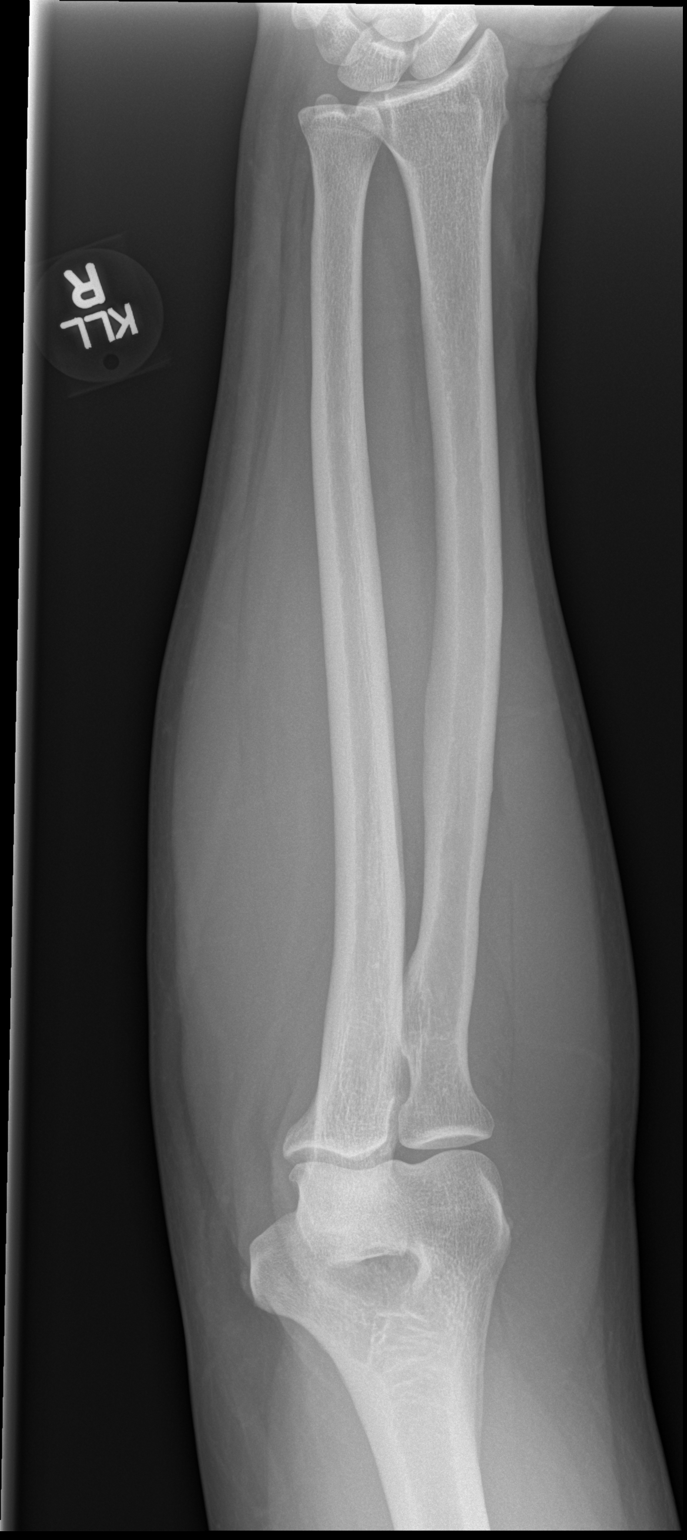

[x forearm lat right]
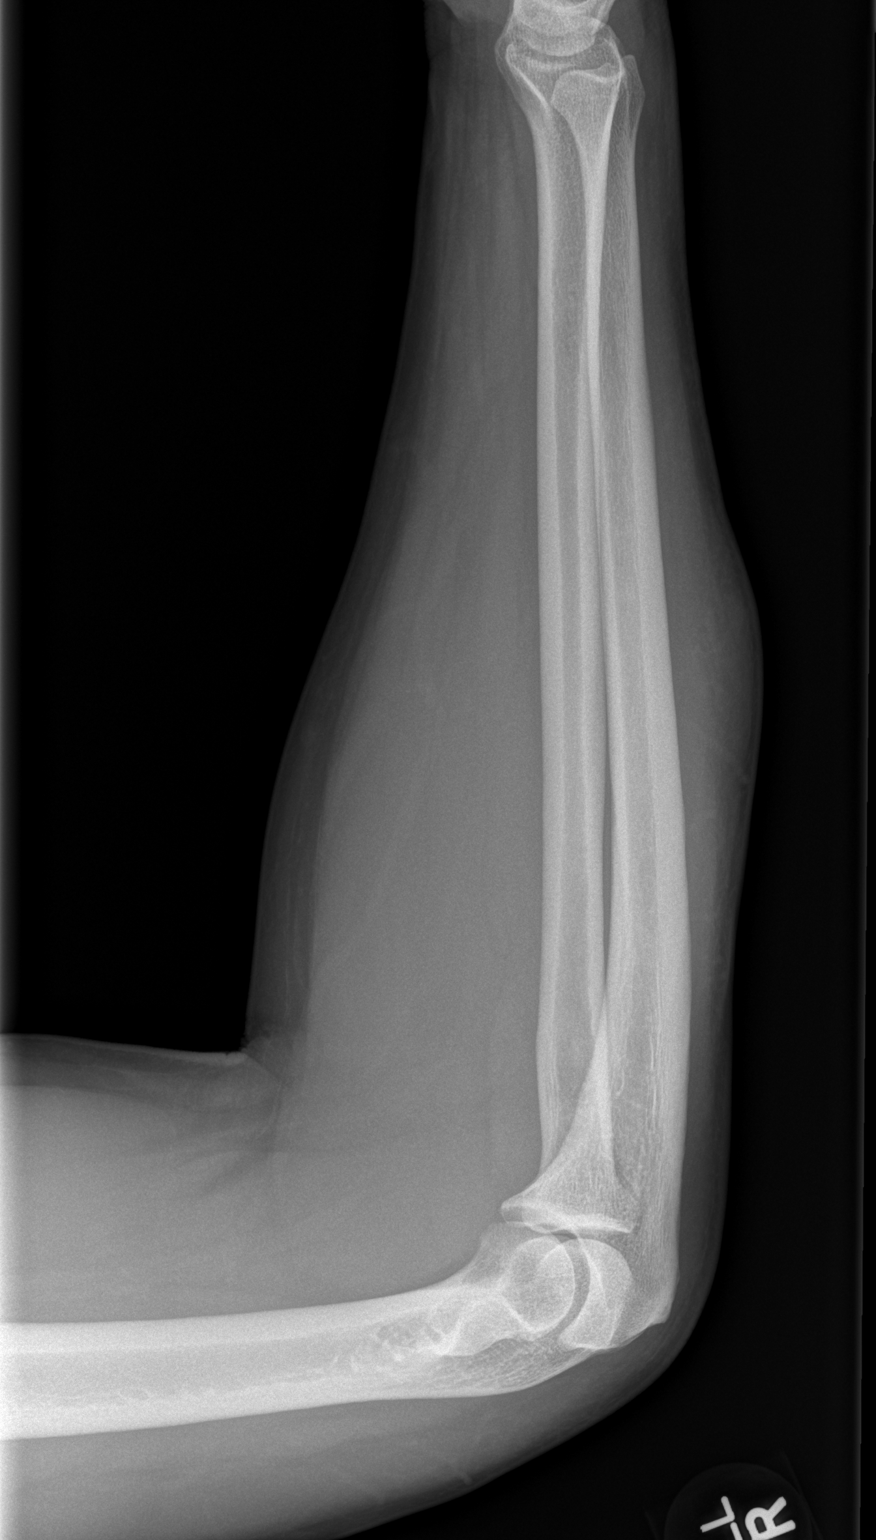

[2 of 2 positions shown; findings below may reference images not displayed]

FINDINGS: AP and lateral views of the left forearm reveal the radius and ulna
to be intact. The observed portions of the wrist and elbow exhibit
no acute abnormalities. There is soft tissue swelling over the
extensor surface of the mid forearm. No soft tissue foreign bodies
are demonstrated.
IMPRESSION: There is no acute bony abnormality of the right forearm. There is
soft tissue swelling over the extensor surface of the mid forearm.

## 2014-07-18 ENCOUNTER — Encounter (HOSPITAL_BASED_OUTPATIENT_CLINIC_OR_DEPARTMENT_OTHER): Payer: Self-pay | Admitting: *Deleted

## 2014-07-18 ENCOUNTER — Emergency Department (HOSPITAL_BASED_OUTPATIENT_CLINIC_OR_DEPARTMENT_OTHER)
Admission: EM | Admit: 2014-07-18 | Discharge: 2014-07-18 | Disposition: A | Discharge status: Home / Self Care | Payer: Worker's Compensation | Attending: Emergency Medicine | Admitting: Emergency Medicine

## 2014-07-18 DIAGNOSIS — Z86010 Personal history of colonic polyps: Secondary | ICD-10-CM | POA: Insufficient documentation

## 2014-07-18 DIAGNOSIS — Z8669 Personal history of other diseases of the nervous system and sense organs: Secondary | ICD-10-CM | POA: Insufficient documentation

## 2014-07-18 DIAGNOSIS — Y9355 Activity, bike riding: Secondary | ICD-10-CM | POA: Diagnosis not present

## 2014-07-18 DIAGNOSIS — Y92410 Unspecified street and highway as the place of occurrence of the external cause: Secondary | ICD-10-CM | POA: Diagnosis not present

## 2014-07-18 DIAGNOSIS — Z791 Long term (current) use of non-steroidal anti-inflammatories (NSAID): Secondary | ICD-10-CM | POA: Insufficient documentation

## 2014-07-18 DIAGNOSIS — S60221A Contusion of right hand, initial encounter: Secondary | ICD-10-CM

## 2014-07-18 DIAGNOSIS — S6991XA Unspecified injury of right wrist, hand and finger(s), initial encounter: Secondary | ICD-10-CM | POA: Diagnosis present

## 2014-07-18 DIAGNOSIS — J45909 Unspecified asthma, uncomplicated: Secondary | ICD-10-CM | POA: Diagnosis not present

## 2014-07-18 DIAGNOSIS — Z79899 Other long term (current) drug therapy: Secondary | ICD-10-CM | POA: Diagnosis not present

## 2014-07-18 DIAGNOSIS — M13871 Other specified arthritis, right ankle and foot: Secondary | ICD-10-CM | POA: Insufficient documentation

## 2014-07-18 DIAGNOSIS — S80212A Abrasion, left knee, initial encounter: Secondary | ICD-10-CM | POA: Diagnosis not present

## 2014-07-18 DIAGNOSIS — Y990 Civilian activity done for income or pay: Secondary | ICD-10-CM | POA: Insufficient documentation

## 2014-07-18 NOTE — Discharge Instructions (Signed)
Contusion °A contusion is a deep bruise. Contusions are the result of an injury that caused bleeding under the skin. The contusion may turn blue, purple, or yellow. Minor injuries will give you a painless contusion, but more severe contusions may stay painful and swollen for a few weeks.  °CAUSES  °A contusion is usually caused by a blow, trauma, or direct force to an area of the body. °SYMPTOMS  °· Swelling and redness of the injured area. °· Bruising of the injured area. °· Tenderness and soreness of the injured area. °· Pain. °DIAGNOSIS  °The diagnosis can be made by taking a history and physical exam. An X-ray, CT scan, or MRI may be needed to determine if there were any associated injuries, such as fractures. °TREATMENT  °Specific treatment will depend on what area of the body was injured. In general, the best treatment for a contusion is resting, icing, elevating, and applying cold compresses to the injured area. Over-the-counter medicines may also be recommended for pain control. Ask your caregiver what the best treatment is for your contusion. °HOME CARE INSTRUCTIONS  °· Put ice on the injured area. °¨ Put ice in a plastic bag. °¨ Place a towel between your skin and the bag. °¨ Leave the ice on for 15-20 minutes, 3-4 times a day, or as directed by your health care provider. °· Only take over-the-counter or prescription medicines for pain, discomfort, or fever as directed by your caregiver. Your caregiver may recommend avoiding anti-inflammatory medicines (aspirin, ibuprofen, and naproxen) for 48 hours because these medicines may increase bruising. °· Rest the injured area. °· If possible, elevate the injured area to reduce swelling. °SEEK IMMEDIATE MEDICAL CARE IF:  °· You have increased bruising or swelling. °· You have pain that is getting worse. °· Your swelling or pain is not relieved with medicines. °MAKE SURE YOU:  °· Understand these instructions. °· Will watch your condition. °· Will get help right  away if you are not doing well or get worse. °Document Released: 03/14/2005 Document Revised: 06/09/2013 Document Reviewed: 04/09/2011 °ExitCare® Patient Information ©2015 [employer]. This information is not intended to replace advice given to you by your health care provider. Make sure you discuss any questions you have with your health care provider. ° °
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
## 2016-08-23 ENCOUNTER — Ambulatory Visit (INDEPENDENT_AMBULATORY_CARE_PROVIDER_SITE_OTHER): Payer: BLUE CROSS/BLUE SHIELD | Admitting: Orthopaedic Surgery

## 2016-08-23 ENCOUNTER — Encounter (INDEPENDENT_AMBULATORY_CARE_PROVIDER_SITE_OTHER): Payer: Self-pay | Admitting: Orthopaedic Surgery

## 2016-08-23 VITALS — BP 140/80 | HR 72 | Resp 14 | Ht 71.0 in | Wt 240.0 lb

## 2016-08-23 DIAGNOSIS — R52 Pain, unspecified: Secondary | ICD-10-CM | POA: Diagnosis not present

## 2016-08-23 LAB — CBC WITH DIFFERENTIAL/PLATELET
Basophils Absolute: 57 cells/uL (ref 0–200)
Basophils Relative: 1 %
Eosinophils Absolute: 228 cells/uL (ref 15–500)
Eosinophils Relative: 4 %
HCT: 41.7 % (ref 38.5–50.0)
Hemoglobin: 14.4 g/dL (ref 13.2–17.1)
Lymphocytes Relative: 36 %
Lymphs Abs: 2052 cells/uL (ref 850–3900)
MCH: 30 pg (ref 27.0–33.0)
MCHC: 34.5 g/dL (ref 32.0–36.0)
MCV: 86.9 fL (ref 80.0–100.0)
MPV: 10.3 fL (ref 7.5–12.5)
Monocytes Absolute: 399 cells/uL (ref 200–950)
Monocytes Relative: 7 %
Neutro Abs: 2964 cells/uL (ref 1500–7800)
Neutrophils Relative %: 52 %
Platelets: 256 10*3/uL (ref 140–400)
RBC: 4.8 MIL/uL (ref 4.20–5.80)
RDW: 13.7 % (ref 11.0–15.0)
WBC: 5.7 10*3/uL (ref 3.8–10.8)

## 2016-08-23 LAB — COMPLETE METABOLIC PANEL WITH GFR
ALT: 36 U/L (ref 9–46)
AST: 33 U/L (ref 10–35)
Albumin: 4.3 g/dL (ref 3.6–5.1)
Alkaline Phosphatase: 45 U/L (ref 40–115)
BUN: 10 mg/dL (ref 7–25)
CO2: 25 mmol/L (ref 20–31)
Calcium: 9.9 mg/dL (ref 8.6–10.3)
Chloride: 103 mmol/L (ref 98–110)
Creat: 0.93 mg/dL (ref 0.70–1.33)
GFR, Est African American: >89 mL/min (ref >=60)
GFR, Est Non African American: >89 mL/min (ref >=60)
Glucose, Bld: 88 mg/dL (ref 65–99)
Potassium: 4.4 mmol/L (ref 3.5–5.3)
Sodium: 139 mmol/L (ref 135–146)
Total Bilirubin: 0.5 mg/dL (ref 0.2–1.2)
Total Protein: 7.7 g/dL (ref 6.1–8.1)

## 2016-08-23 LAB — URIC ACID: Uric Acid, Serum: 7.6 mg/dL (ref 4.0–8.0)

## 2016-08-23 LAB — TSH: TSH: 1.43 mIU/L (ref 0.40–4.50)
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
## 2016-08-24 LAB — SEDIMENTATION RATE: Sed Rate: 4 mm/hr (ref 0–20)

## 2016-08-24 LAB — VITAMIN D 25 HYDROXY (VIT D DEFICIENCY, FRACTURES): Vit D, 25-Hydroxy: 33 ng/mL (ref 30–100)

## 2016-08-24 LAB — C-REACTIVE PROTEIN: CRP: 6 mg/L (ref <8.0)

## 2016-08-24 LAB — CYCLIC CITRUL PEPTIDE ANTIBODY, IGG: Cyclic Citrullin Peptide Ab: <16 Units

## 2016-08-27 ENCOUNTER — Ambulatory Visit (INDEPENDENT_AMBULATORY_CARE_PROVIDER_SITE_OTHER): Payer: Self-pay | Admitting: Orthopaedic Surgery

## 2016-08-27 LAB — PROTEIN ELECTROPHORESIS, SERUM, WITH REFLEX
Abnormal Protein Band1: 1.4 g/dL
Albumin ELP: 4.3 g/dL (ref 3.8–4.8)
Alpha-1-Globulin: 0.3 g/dL (ref 0.2–0.3)
Alpha-2-Globulin: 0.5 g/dL (ref 0.5–0.9)
Beta 2: 0.2 g/dL (ref 0.2–0.5)
Beta Globulin: 0.4 g/dL (ref 0.4–0.6)
Gamma Globulin: 2 g/dL — ABNORMAL HIGH (ref 0.8–1.7)
Total Protein, Serum Electrophoresis: 7.7 g/dL (ref 6.1–8.1)

## 2016-08-27 LAB — IFE INTERPRETATION

## 2016-08-30 ENCOUNTER — Emergency Department (HOSPITAL_COMMUNITY): Payer: BLUE CROSS/BLUE SHIELD

## 2016-08-30 ENCOUNTER — Emergency Department (HOSPITAL_COMMUNITY)
Admission: EM | Admit: 2016-08-30 | Discharge: 2016-08-30 | Disposition: A | Discharge status: Home / Self Care | Payer: BLUE CROSS/BLUE SHIELD | Attending: Emergency Medicine | Admitting: Emergency Medicine

## 2016-08-30 ENCOUNTER — Encounter (HOSPITAL_COMMUNITY): Payer: Self-pay

## 2016-08-30 DIAGNOSIS — Z79899 Other long term (current) drug therapy: Secondary | ICD-10-CM | POA: Insufficient documentation

## 2016-08-30 DIAGNOSIS — J45909 Unspecified asthma, uncomplicated: Secondary | ICD-10-CM | POA: Diagnosis not present

## 2016-08-30 DIAGNOSIS — R51 Headache: Secondary | ICD-10-CM

## 2016-08-30 DIAGNOSIS — R112 Nausea with vomiting, unspecified: Secondary | ICD-10-CM | POA: Diagnosis not present

## 2016-08-30 DIAGNOSIS — G4489 Other headache syndrome: Secondary | ICD-10-CM | POA: Diagnosis not present

## 2016-08-30 DIAGNOSIS — R519 Headache, unspecified: Secondary | ICD-10-CM

## 2016-08-30 MED ORDER — ONDANSETRON HCL 4 MG/2ML IJ SOLN
4.0000 mg | Freq: Once | INTRAMUSCULAR | Status: AC
  Administered 2016-08-30: 4 mg via INTRAVENOUS
  Filled 2016-08-30: qty 2

## 2016-08-30 MED ORDER — PROCHLORPERAZINE EDISYLATE 5 MG/ML IJ SOLN
10.0000 mg | Freq: Once | INTRAMUSCULAR | Status: AC
  Administered 2016-08-30: 10 mg via INTRAVENOUS
  Filled 2016-08-30: qty 2

## 2016-08-30 MED ORDER — HYDROMORPHONE HCL 1 MG/ML IJ SOLN
1.0000 mg | Freq: Once | INTRAMUSCULAR | Status: AC
  Administered 2016-08-30: 1 mg via INTRAVENOUS
  Filled 2016-08-30: qty 1

## 2016-08-30 NOTE — ED Triage Notes (Signed)
Per EMS-Patient at work and c/o sudden onset of a headache and nausea. Patient denies dizziness, chest pain, or numbness. Patient did c/o slight nausea.
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
## 2016-08-30 NOTE — Discharge Instructions (Signed)
Take Tylenol as needed for pain. Return if your discomfort is not well controlled or if concern for any reason or see yourprimary care physician. No driving or operating heavy machinery for the next 24 hours as you were given opioid pain medicine today which can cause drowsiness

## 2016-08-30 NOTE — ED Triage Notes (Signed)
Patient reports that he vomited x 1. Patient denies any blurred vision or sensitivity to light and sound.

## 2016-08-30 NOTE — ED Notes (Signed)
Bed: [id number] Expected date:  Expected time:  Means of arrival:  Comments: No bed

## 2016-08-30 NOTE — ED Notes (Signed)
After administration of dilaudid, pt required 02 via [place] d/t respiratory depression.  Pt brady'd down to 40s.  Dr Winfred Leeds made aware.  Will continue to monitor.

## 2016-12-22 ENCOUNTER — Encounter (HOSPITAL_BASED_OUTPATIENT_CLINIC_OR_DEPARTMENT_OTHER): Payer: Self-pay | Admitting: Emergency Medicine

## 2016-12-22 ENCOUNTER — Emergency Department (HOSPITAL_BASED_OUTPATIENT_CLINIC_OR_DEPARTMENT_OTHER)
Admission: EM | Admit: 2016-12-22 | Discharge: 2016-12-22 | Disposition: A | Discharge status: Home / Self Care | Payer: BLUE CROSS/BLUE SHIELD | Attending: Emergency Medicine | Admitting: Emergency Medicine

## 2016-12-22 DIAGNOSIS — J45909 Unspecified asthma, uncomplicated: Secondary | ICD-10-CM | POA: Diagnosis not present

## 2016-12-22 DIAGNOSIS — W57XXXA Bitten or stung by nonvenomous insect and other nonvenomous arthropods, initial encounter: Secondary | ICD-10-CM | POA: Insufficient documentation

## 2016-12-22 DIAGNOSIS — R221 Localized swelling, mass and lump, neck: Secondary | ICD-10-CM | POA: Diagnosis not present

## 2016-12-22 MED ORDER — DIPHENHYDRAMINE HCL 25 MG PO CAPS
50.0000 mg | ORAL_CAPSULE | Freq: Once | ORAL | Status: AC
  Administered 2016-12-22: 50 mg via ORAL
  Filled 2016-12-22: qty 2

## 2016-12-22 MED ORDER — TRIAMCINOLONE ACETONIDE 0.1 % EX CREA: 1.0000 "application " | TOPICAL_CREAM | Freq: Two times a day (BID) | CUTANEOUS | 0 refills | Status: DC

## 2016-12-22 MED ORDER — DIPHENHYDRAMINE HCL 25 MG PO CAPS: 25.0000 mg | ORAL_CAPSULE | Freq: Four times a day (QID) | ORAL | 0 refills | Status: DC | PRN

## 2016-12-22 NOTE — ED Triage Notes (Signed)
Pt presents to ED with c/o insect bite to back of neck. Pt reports he was in the woods earlier today. Neck red and swollen.

## 2016-12-22 NOTE — Discharge Instructions (Signed)
Please read and follow all provided instructions.  Your diagnoses today include:  1. Insect bite, initial encounter    Tests performed today include:  Vital signs. See below for your results today.   Medications prescribed:   Triamcinolone (Kenalog) cream - topical steroid medication for skin reaction   Benadryl (diphenhydramine) - antihistamine  You can find this medication over-the-counter.   DO NOT exceed:   50mg  Benadryl every 6 hours    Benadryl will make you drowsy. DO NOT drive or perform any activities that require you to be awake and alert if taking this.  Take any prescribed medications only as directed.   Home care instructions:  Follow any educational materials contained in this packet. Keep affected area above the level of your heart when possible.  Follow-up instructions: Please follow-up with your primary care provider in the next 1 week for further evaluation of your symptoms.   Return instructions:  Return to the Emergency Department if you have:  Fever  Worsening symptoms  Worsening pain  Worsening swelling  Redness of the skin that moves away from the affected area, especially if it streaks away from the affected area   Any other emergent concerns  Your vital signs today were: BP 132/84 (BP Location: Left Arm)    Pulse 73    Temp 98.1 F (36.7 C) (Oral)    Resp 20    Ht 5\' 11"  (1.803 m)    Wt 108 kg (238 lb)    SpO2 99%    BMI 33.19 kg/m  If your blood pressure (BP) was elevated above 135/85 this visit, please have this repeated by your doctor within one month. --------------

## 2016-12-22 NOTE — ED Provider Notes (Signed)
[facility] Provider Note   CSN: [id number] Arrival date & time: 12/22/16  2214   By signing my name below, I, Eunice Blase, attest that this documentation has been prepared under the direction and in the presence of Josh Razi Hickle PA-C. Electronically signed, Eunice Blase, ED Scribe. 12/22/16. 11:00 PM.  History   Chief Complaint Chief Complaint  Patient presents with  . Insect Bite   The history is provided by the patient and medical records. No language interpreter was used.    Calvin Lin is a 56 y.o. male presenting to the Emergency Department concerning posterior neck hot, itching discomfort onset ~6 PM this evening. Pt suspects an insect bite to the aforementioned area of discomfort, and he currently reports worsening redness and swelling to the area. He alleges he felt a sting earlier. No known allergies. He states he applied ice and OTC ointment to the area. No other PTA medications. No pain, lightheadedness or N/V. No other complaints at this time.   Past Medical History:  Diagnosis Date  . Arthritis    r ankle  . Asthma   . Personal history of colonic polyps - adenomas 09/13/2010  . Sleep apnea     Patient Active Problem List   Diagnosis Date Noted  . Personal history of colonic polyps - adenomas 09/13/2010  . ANXIETY 03/24/2007  . ASTHMA 03/24/2007  . OSTEOARTHRITIS 03/24/2007    Past Surgical History:  Procedure Laterality Date  . CARPAL TUNNEL RELEASE     right  . r broken ankle repair    . vein repair r flank         Home Medications    Prior to Admission medications   Medication Sig Start Date End Date Taking? Authorizing Provider  hydrOXYzine (ATARAX/VISTARIL) 25 MG tablet Take 1 tablet (25 mg total) by mouth 3 (three) times daily as needed for itching. Patient not taking: Reported on 08/30/2016 10/18/13   Lutricia Feil, PA  ibuprofen (ADVIL,MOTRIN) 800 MG tablet Take 1 tablet (800 mg total) by mouth 3 (three) times  daily. Patient not taking: Reported on 08/30/2016 10/12/13   Domenic Moras, PA-C  predniSONE (DELTASONE) 10 MG tablet 4 tab po qd day 1, 3 po qd day 2, 2 po qd day 3, 1 po qd days 4 & 5 then stop Patient not taking: Reported on 08/30/2016 10/18/13   Lutricia Feil, PA  vitamin C (ASCORBIC ACID) 500 MG tablet Take 500 mg by mouth daily.    [provider]    Family History Family History  Problem Relation Age of Onset  . Colon cancer Father     Social History Social History  Substance Use Topics  . Smoking status: Never Smoker  . Smokeless tobacco: Never Used  . Alcohol use 0.6 oz/week    1 Cans of beer per week     Allergies   Patient has no known allergies.   Review of Systems Review of Systems  Gastrointestinal: Negative for nausea and vomiting.  Musculoskeletal: Negative for arthralgias, myalgias, neck pain and neck stiffness.  Skin: Positive for color change. Negative for rash and wound.       Positive raised area of skin to posterior neck.  Neurological: Negative for headaches.  All other systems reviewed and are negative.    Physical Exam Updated Vital Signs BP 132/84 (BP Location: Left Arm)   Pulse 73   Temp 98.1 F (36.7 C) (Oral)   Resp 20   Ht 5'  11" (1.803 m)   Wt 238 lb (108 kg)   SpO2 99%   BMI 33.19 kg/m   Physical Exam  Constitutional: He appears well-developed and well-nourished.  HENT:  Head: Normocephalic and atraumatic.  Eyes: Conjunctivae are normal.  Neck: Normal range of motion. Neck supple.  Pulmonary/Chest: No respiratory distress.  Neurological: He is alert.  Skin: Skin is warm and dry.  Mild raised area of swelling to the posterior neck without palpable fluctuance or abscess. No tenderness or warmth. Area does not spread onto lateral neck.  Psychiatric: He has a normal mood and affect.  Nursing note and vitals reviewed.    ED Treatments / Results  DIAGNOSTIC STUDIES: Oxygen Saturation is 99% on RA, NL by my  interpretation.    COORDINATION OF CARE: 10:57 PM-Discussed next steps with pt. Pt verbalized understanding and is agreeable with the plan. Will order benadryl and Rx topical steroid medications. Pt prepared for d/c, advised of symptomatic care at home, F/U instructions and return precautions.   Procedures Procedures (including critical care time)  Medications Ordered in ED Medications - No data to display   Initial Impression / Assessment and Plan / ED Course  I have reviewed the triage vital signs and the nursing notes.  Pertinent labs & imaging results that were available during my care of the patient were reviewed by me and considered in my medical decision making (see chart for details).     Patient seen and examined.  Vital signs reviewed and are as follows: BP 132/84 (BP Location: Left Arm)   Pulse 73   Temp 98.1 F (36.7 C) (Oral)   Resp 20   Ht 5\' 11"  (1.803 m)   Wt 108 kg (238 lb)   SpO2 99%   BMI 33.19 kg/m   Patient urged to return with worsening symptoms or other concerns. Patient verbalized understanding and agrees with plan.    Final Clinical Impressions(s) / ED Diagnoses   Final diagnoses:  Insect bite, initial encounter   Patient with localized allergic reaction due to insect bite or sting. No signs of anaphylaxis. Full range of motion of neck without difficulty breathing or swallowing. Do not suspect cellulitis or infection.  New Prescriptions Discharge Medication List as of 12/22/2016 11:11 PM    START taking these medications   Details  diphenhydrAMINE (BENADRYL) 25 mg capsule Take 1 capsule (25 mg total) by mouth every 6 (six) hours as needed for itching., Starting Sat 12/22/2016, Print    triamcinolone cream (KENALOG) 0.1 % Apply 1 application topically 2 (two) times daily., Starting Sat 12/22/2016, Print      I personally performed the services described in this documentation, which was scribed in my presence. The recorded information has been  reviewed and is accurate.    Carlisle Cater, PA-C 12/23/16 2229    Sherwood Gambler, MD 12/23/16 2312

## 2018-09-08 NOTE — Progress Notes (Deleted)
[facility] [address] [place], [place] [address] Phone: [phone] Subjective:    I'm seeing this patient by the request  of:    CC: Multiple joint pain  NID:[id number]  Calvin Lin is a 58 y.o. male coming in with complaint of ***  Onset-  Location Duration-  Character- Aggravating factors- Reliving factors-  Therapies tried-  Severity-     Past Medical History:  Diagnosis Date  . Arthritis    r ankle  . Asthma   . Personal history of colonic polyps - adenomas 09/13/2010  . Sleep apnea    Past Surgical History:  Procedure Laterality Date  . CARPAL TUNNEL RELEASE     right  . r broken ankle repair    . vein repair r flank     Social History   Socioeconomic History  . Marital status: Married    Spouse name: Not on file  . Number of children: Not on file  . Years of education: Not on file  . Highest education level: Not on file  Occupational History  . Not on file  Social Needs  . Financial resource strain: Not on file  . Food insecurity:    Worry: Not on file    Inability: Not on file  . Transportation needs:    Medical: Not on file    Non-medical: Not on file  Tobacco Use  . Smoking status: Never Smoker  . Smokeless tobacco: Never Used  Substance and Sexual Activity  . Alcohol use: Yes    Alcohol/week: 1.0 standard drinks    Types: 1 Cans of beer per week  . Drug use: No  . Sexual activity: Not on file  Lifestyle  . Physical activity:    Days per week: Not on file    Minutes per session: Not on file  . Stress: Not on file  Relationships  . Social connections:    Talks on phone: Not on file    Gets together: Not on file    Attends religious service: Not on file    Active member of club or organization: Not on file    Attends meetings of clubs or organizations: Not on file    Relationship status: Not on file  Other Topics Concern  . Not on file  Social History Narrative  . Not on file   No Known  Allergies Family History  Problem Relation Age of Onset  . Colon cancer Father       Current Outpatient Medications (Respiratory):  .  diphenhydrAMINE (BENADRYL) 25 mg capsule, Take 1 capsule (25 mg total) by mouth every 6 (six) hours as needed for itching.    Current Outpatient Medications (Other):  .  triamcinolone cream (KENALOG) 0.1 %, Apply 1 application topically 2 (two) times daily. .  vitamin C (ASCORBIC ACID) 500 MG tablet, Take 500 mg by mouth daily.    Past medical history, social, surgical and family history all reviewed in electronic medical record.  No pertanent information unless stated regarding to the chief complaint.   Review of Systems:  No headache, visual changes, nausea, vomiting, diarrhea, constipation, dizziness, abdominal pain, skin rash, fevers, chills, night sweats, weight loss, swollen lymph nodes, body aches, joint swelling, muscle aches, chest pain, shortness of breath, mood changes.   Objective  There were no vitals taken for this visit. Systems examined below as of    General: No apparent distress alert and oriented x3 mood and affect normal, dressed appropriately.  HEENT: Pupils equal, extraocular movements intact  Respiratory: Patient's speak in full sentences and does not appear short of breath  Cardiovascular: No lower extremity edema, non tender, no erythema  Skin: Warm dry intact with no signs of infection or rash on extremities or on axial skeleton.  Abdomen: Soft nontender  Neuro: Cranial nerves II through XII are intact, neurovascularly intact in all extremities with 2+ DTRs and 2+ pulses.  Lymph: No lymphadenopathy of posterior or anterior cervical chain or axillae bilaterally.  Gait normal with good balance and coordination.  MSK:  Non tender with full range of motion and good stability and symmetric strength and tone of shoulders, elbows, wrist, hip, knee and ankles bilaterally.     Impression and Recommendations:     This case  required medical decision making of moderate complexity. The above documentation has been reviewed and is accurate and complete Lyndal Pulley, DO       Note: This dictation was prepared with Dragon dictation along with smaller phrase technology. Any transcriptional errors that result from this process are unintentional.

## 2018-09-09 ENCOUNTER — Ambulatory Visit: Payer: Self-pay | Admitting: Family Medicine

## 2018-09-09 ENCOUNTER — Ambulatory Visit: Payer: Self-pay

## 2018-09-09 ENCOUNTER — Other Ambulatory Visit: Payer: Self-pay

## 2018-09-09 ENCOUNTER — Other Ambulatory Visit (INDEPENDENT_AMBULATORY_CARE_PROVIDER_SITE_OTHER): Payer: BLUE CROSS/BLUE SHIELD

## 2018-09-09 ENCOUNTER — Encounter: Payer: Self-pay | Admitting: Family Medicine

## 2018-09-09 ENCOUNTER — Ambulatory Visit: Payer: BLUE CROSS/BLUE SHIELD | Admitting: Family Medicine

## 2018-09-09 VITALS — BP 102/62 | HR 78 | Ht 71.0 in | Wt 246.0 lb

## 2018-09-09 DIAGNOSIS — M2550 Pain in unspecified joint: Secondary | ICD-10-CM

## 2018-09-09 DIAGNOSIS — G8929 Other chronic pain: Secondary | ICD-10-CM

## 2018-09-09 DIAGNOSIS — M25571 Pain in right ankle and joints of right foot: Secondary | ICD-10-CM

## 2018-09-09 DIAGNOSIS — M19071 Primary osteoarthritis, right ankle and foot: Secondary | ICD-10-CM

## 2018-09-09 LAB — TSH: TSH: 1.62 u[IU]/mL (ref 0.35–4.50)

## 2018-09-09 LAB — FERRITIN: Ferritin: 59.9 ng/mL (ref 22.0–322.0)

## 2018-09-09 LAB — VITAMIN D 25 HYDROXY (VIT D DEFICIENCY, FRACTURES): VITD: 25.37 ng/mL — ABNORMAL LOW (ref 30.00–100.00)

## 2018-09-09 LAB — IBC PANEL
Iron: 129 ug/dL (ref 42–165)
Saturation Ratios: 32.1 % (ref 20.0–50.0)
Transferrin: 287 mg/dL (ref 212.0–360.0)

## 2018-09-09 LAB — TESTOSTERONE: Testosterone: 204.65 ng/dL — ABNORMAL LOW (ref 300.00–890.00)

## 2018-09-09 LAB — SEDIMENTATION RATE: Sed Rate: 8 mm/hr (ref 0–20)

## 2018-09-09 MED ORDER — VITAMIN D (ERGOCALCIFEROL) 1.25 MG (50000 UNIT) PO CAPS: 50000.0000 [IU] | ORAL_CAPSULE | ORAL | 0 refills | Status: DC

## 2018-09-09 MED ORDER — DICLOFENAC SODIUM 2 % TD SOLN: 2.0000 g | Freq: Two times a day (BID) | TRANSDERMAL | 3 refills | Status: DC
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
## 2018-09-09 NOTE — Assessment & Plan Note (Signed)
Multiple aches and pains, had laboratory work-up 2 years ago.  No significant findings.  Still having pain at this time.  Will recheck certain labs as well as add testosterone which patient has not had checked.  See if that is contributing to any of the aches and pains.  Encourage weight loss, discussed over-the-counter medications.  Follow-up again in 4 to 8 weeks

## 2018-09-09 NOTE — Assessment & Plan Note (Signed)
Discuss custom bracing which patient declined, discussed the possibility of PRP which patient will consider and will have a follow-up in 3 weeks.  Discussed topical anti-inflammatories, laboratory work-up that I think will be beneficial, patient does have moderate to severe osteoarthritic changes noted on ultrasound today.  Patient is in agreement with the plan and will be following up in 3 to 4 weeks

## 2018-09-09 NOTE — Patient Instructions (Signed)
Good to see you.  Ice 20 minutes 2 times daily. Usually after activity and before bed. Keep wearing good shoes.  pennsaid pinkie amount topically 2 times daily as needed.  Once weekly vitamin D for 12 weeks Over the counter get Turmeric 500mg  daily  Tart cherry extract any dose at night I think the orthotics is a good idea We will hold on braces but will consider them  We will set up again in 3 weeks

## 2018-09-10 ENCOUNTER — Telehealth: Payer: Self-pay | Admitting: Emergency Medicine

## 2018-09-10 NOTE — Telephone Encounter (Signed)
Copied from [place] [phone]. Topic: Quick Communication - Rx Refill/Question >> Sep 10, 2018 12:23 PM Ahmed Prima L wrote: Medication: triamcinolone cream (KENALOG) 0.1 % - also he would like to know is he suppose to be taking the diphenhydrAMINE (BENADRYL) 25 mg capsule?    Has the patient contacted their pharmacy? Yes - the pharmacy said they never received a script (Agent: If no, request that the patient contact the pharmacy for the refill.) (Agent: If yes, when and what did the pharmacy advise?)  Preferred Pharmacy (with phone number or street name): [id number] [address], [place] [address] Phone: [phone] Fax: [phone]    Agent: Please be advised that RX refills may take up to 3 business days. We ask that you follow-up with your pharmacy.

## 2018-09-10 NOTE — Telephone Encounter (Signed)
I did not prescribe either of those to him.  Will send to pcp

## 2018-09-11 LAB — PTH, INTACT AND CALCIUM
Calcium: 10.1 mg/dL (ref 8.6–10.3)
PTH: 9 pg/mL — ABNORMAL LOW (ref 14–64)

## 2018-09-30 ENCOUNTER — Ambulatory Visit: Payer: Self-pay

## 2018-09-30 ENCOUNTER — Encounter: Payer: Self-pay | Admitting: Family Medicine

## 2018-09-30 ENCOUNTER — Other Ambulatory Visit: Payer: Self-pay

## 2018-09-30 ENCOUNTER — Ambulatory Visit (INDEPENDENT_AMBULATORY_CARE_PROVIDER_SITE_OTHER): Payer: BLUE CROSS/BLUE SHIELD | Admitting: Family Medicine

## 2018-09-30 VITALS — Ht 71.0 in | Wt 246.0 lb

## 2018-09-30 DIAGNOSIS — M25571 Pain in right ankle and joints of right foot: Principal | ICD-10-CM

## 2018-09-30 DIAGNOSIS — M19071 Primary osteoarthritis, right ankle and foot: Secondary | ICD-10-CM

## 2018-09-30 DIAGNOSIS — G8929 Other chronic pain: Secondary | ICD-10-CM

## 2018-09-30 NOTE — Progress Notes (Signed)
[facility] [address], [place] [address] Phone: [phone] Subjective:      CC: ankle pain follow up   XBL:[id number]  Erika Hussar is a 58 y.o. male coming in with complaint of right ankle pain.  Known ankle arthritis.  Has failed all conservative therapy and even stem cells from an outside facility with no significant improvement.  Patient is here for PRP injection.  Looking for anything to help with some of the pain on a regular basis.     Past Medical History:  Diagnosis Date  . Arthritis    r ankle  . Asthma   . Personal history of colonic polyps - adenomas 09/13/2010  . Sleep apnea    Past Surgical History:  Procedure Laterality Date  . CARPAL TUNNEL RELEASE     right  . r broken ankle repair    . vein repair r flank     Social History   Socioeconomic History  . Marital status: Married    Spouse name: Not on file  . Number of children: Not on file  . Years of education: Not on file  . Highest education level: Not on file  Occupational History  . Not on file  Social Needs  . Financial resource strain: Not on file  . Food insecurity:    Worry: Not on file    Inability: Not on file  . Transportation needs:    Medical: Not on file    Non-medical: Not on file  Tobacco Use  . Smoking status: Never Smoker  . Smokeless tobacco: Never Used  Substance and Sexual Activity  . Alcohol use: Yes    Alcohol/week: 1.0 standard drinks    Types: 1 Cans of beer per week  . Drug use: No  . Sexual activity: Not on file  Lifestyle  . Physical activity:    Days per week: Not on file    Minutes per session: Not on file  . Stress: Not on file  Relationships  . Social connections:    Talks on phone: Not on file    Gets together: Not on file    Attends religious service: Not on file    Active member of club or organization: Not on file    Attends meetings of clubs or organizations: Not on file    Relationship status: Not on  file  Other Topics Concern  . Not on file  Social History Narrative  . Not on file   No Known Allergies Family History  Problem Relation Age of Onset  . Colon cancer Father       Current Outpatient Medications (Respiratory):  .  diphenhydrAMINE (BENADRYL) 25 mg capsule, Take 1 capsule (25 mg total) by mouth every 6 (six) hours as needed for itching.    Current Outpatient Medications (Other):  Marland Kitchen  Diclofenac Sodium 2 % SOLN, Place 2 g onto the skin 2 (two) times daily. Marland Kitchen  triamcinolone cream (KENALOG) 0.1 %, Apply 1 application topically 2 (two) times daily. .  vitamin C (ASCORBIC ACID) 500 MG tablet, Take 500 mg by mouth daily. .  Vitamin D, Ergocalciferol, (DRISDOL) 1.25 MG (50000 UT) CAPS capsule, Take 1 capsule (50,000 Units total) by mouth every 7 (seven) days.    Past medical history, social, surgical and family history all reviewed in electronic medical record.  No pertanent information unless stated regarding to the chief complaint.   Review of Systems:  No headache, visual changes, nausea, vomiting,  diarrhea, constipation, dizziness, abdominal pain, skin rash, fevers, chills, night sweats, weight loss, swollen lymph nodes, body aches, joint swelling, muscle aches, chest pain, shortness of breath, mood changes.   Objective  There were no vitals taken for this visit. Systems examined below as of    General: No apparent distress alert and oriented x3 mood and affect normal, dressed appropriately.  HEENT: Pupils equal, extraocular movements intact  Respiratory: Patient's speak in full sentences and does not appear short of breath  Cardiovascular: No lower extremity edema, non tender, no erythema  Skin: Warm dry intact with no signs of infection or rash on extremities or on axial skeleton.  Abdomen: Soft nontender  Neuro: Cranial nerves II through XII are intact, neurovascularly intact in all extremities with 2+ DTRs and 2+ pulses.  Lymph: No lymphadenopathy of posterior  or anterior cervical chain or axillae bilaterally.  Gait normal with good balance and coordination.  MSK:  Non tender with full range of motion and good stability and symmetric strength and tone of shoulders, elbows, wrist, hip, knee  bilaterally.  Ankle: Right ankle shows the patient does have arthritic changes.  Decreased range of motion in all planes of 5 to 10 degrees.  Procedure: Real-time Ultrasound Guided Injection of right ankle mortise Device: GE Logiq Q7 Ultrasound guided injection is preferred based studies that show increased duration, increased effect, greater accuracy, decreased procedural pain, increased response rate, and decreased cost with ultrasound guided versus blind injection.  Verbal informed consent obtained.  Time-out conducted.  Noted no overlying erythema, induration, or other signs of local infection.  Skin prepped in a sterile fashion.  Local anesthesia: Topical Ethyl chloride.  With sterile technique and under real time ultrasound guidance: With a 21-gauge 2 inch needle injected with 3 cc of 0.5% Marcaine and then injected with 3 cc of pre-centrifuge PRP. Completed without difficulty  Pain immediately resolved suggesting accurate placement of the medication.  Advised to call if fevers/chills, erythema, induration, drainage, or persistent bleeding.  Images permanently stored and available for review in the ultrasound unit.  Impression: Technically successful ultrasound guided injection.   Impression and Recommendations:     This case required medical decision making of moderate complexity. The above documentation has been reviewed and is accurate and complete Lyndal Pulley, DO       Note: This dictation was prepared with Dragon dictation along with smaller phrase technology. Any transcriptional errors that result from this process are unintentional.

## 2018-09-30 NOTE — Patient Instructions (Signed)
Good to see you  No ice or ibuprofen for next 72 hours if possible  Ok to do tylenol or heat  Will take weeks to notice improvement  Follow the handout with activity  We can do virtual visit in 3 weeks and then can see you in office in 6 weeks

## 2018-09-30 NOTE — Assessment & Plan Note (Signed)
Right ankle pain.  Patient given PRP injection today.  Tolerated the procedure well.  We discussed with him to increase activity over the course of next 6 weeks.  Warned of any type of potential side effects.  Patient will follow limits icing and ibuprofen over the next 72 hours.  Virtual follow-up in 3 weeks and inpatient follow-up in 6

## 2018-10-22 ENCOUNTER — Encounter: Payer: Self-pay | Admitting: Family Medicine

## 2018-10-22 ENCOUNTER — Ambulatory Visit (INDEPENDENT_AMBULATORY_CARE_PROVIDER_SITE_OTHER): Payer: BLUE CROSS/BLUE SHIELD | Admitting: Family Medicine

## 2018-10-22 DIAGNOSIS — M19071 Primary osteoarthritis, right ankle and foot: Secondary | ICD-10-CM

## 2018-10-22 NOTE — Progress Notes (Signed)
[facility] [address], [place] [address] Phone: [phone] Subjective:    I'Virtual Visit via Video Note  I connected with Calvin Lin on 10/22/18 at  1:30 PM EDT by a video enabled telemedicine application and verified that I am speaking with the correct person using two identifiers.  Location: Patient: Patient was in her home setting of his inlaws Provider: I was in my office setting   I discussed the limitations of evaluation and management by telemedicine and the availability of in person appointments. The patient expressed understanding and agreed to proceed.     I discussed the assessment and treatment plan with the patient. The patient was provided an opportunity to ask questions and all were answered. The patient agreed with the plan and demonstrated an understanding of the instructions.   The patient was advised to call back or seek an in-person evaluation if the symptoms worsen or if the condition fails to improve as anticipated.  I provided 12 minutes of non-face-to-face time during this encounter.   Calvin Pulley, DO    CC: Right ankle pain follow-up  [id number]  Calvin Lin is a 58 y.o. male coming in with complaint of right ankle pain.  Found to have ankle arthritis.  Patient given PRP injection 2 weeks ago.  Doing significantly better.  States 70 to 80% better.  Mild discomfort in the large toe but otherwise nothing remarkable.     Past Medical History:  Diagnosis Date  . Arthritis    r ankle  . Asthma   . Personal history of colonic polyps - adenomas 09/13/2010  . Sleep apnea    Past Surgical History:  Procedure Laterality Date  . CARPAL TUNNEL RELEASE     right  . r broken ankle repair    . vein repair r flank     Social History   Socioeconomic History  . Marital status: Married    Spouse name: Not on file  . Number of children: Not on file  . Years of education: Not on file  . Highest  education level: Not on file  Occupational History  . Not on file  Social Needs  . Financial resource strain: Not on file  . Food insecurity:    Worry: Not on file    Inability: Not on file  . Transportation needs:    Medical: Not on file    Non-medical: Not on file  Tobacco Use  . Smoking status: Never Smoker  . Smokeless tobacco: Never Used  Substance and Sexual Activity  . Alcohol use: Yes    Alcohol/week: 1.0 standard drinks    Types: 1 Cans of beer per week  . Drug use: No  . Sexual activity: Not on file  Lifestyle  . Physical activity:    Days per week: Not on file    Minutes per session: Not on file  . Stress: Not on file  Relationships  . Social connections:    Talks on phone: Not on file    Gets together: Not on file    Attends religious service: Not on file    Active member of club or organization: Not on file    Attends meetings of clubs or organizations: Not on file    Relationship status: Not on file  Other Topics Concern  . Not on file  Social History Narrative  . Not on file   No Known Allergies Family History  Problem Relation Age of Onset  .  Colon cancer Father       Current Outpatient Medications (Respiratory):  .  diphenhydrAMINE (BENADRYL) 25 mg capsule, Take 1 capsule (25 mg total) by mouth every 6 (six) hours as needed for itching.    Current Outpatient Medications (Other):  Marland Kitchen  Diclofenac Sodium 2 % SOLN, Place 2 g onto the skin 2 (two) times daily. Marland Kitchen  triamcinolone cream (KENALOG) 0.1 %, Apply 1 application topically 2 (two) times daily. .  vitamin C (ASCORBIC ACID) 500 MG tablet, Take 500 mg by mouth daily. .  Vitamin D, Ergocalciferol, (DRISDOL) 1.25 MG (50000 UT) CAPS capsule, Take 1 capsule (50,000 Units total) by mouth every 7 (seven) days.    Past medical history, social, surgical and family history all reviewed in electronic medical record.  No pertanent information unless stated regarding to the chief complaint.   Review of  Systems:  No headache, visual changes, nausea, vomiting, diarrhea, constipation, dizziness, abdominal pain, skin rash, fevers, chills, night sweats, weight loss, swollen lymph nodes, body aches, joint swelling, muscle aches, chest pain, shortness of breath, mood changes.   Objective     General: No apparent distress alert and oriented x3 mood and affect normal, dressed appropriately.  HEENT: Pupils equal, extraocular movements intact      Impression and Recommendations:     This case required medical decision making of moderate complexity. The above documentation has been reviewed and is accurate and complete Calvin Pulley, DO       Note: This dictation was prepared with Dragon dictation along with smaller phrase technology. Any transcriptional errors that result from this process are unintentional.

## 2018-10-22 NOTE — Assessment & Plan Note (Signed)
Patient is doing very well 3 weeks out since PRP.  I expect patient do very well overall.  We discussed icing regimen and home exercises.  We discussed continuing vitamin supplementation as well.  Follow-up again in 3 weeks virtually and then hopefully 6 weeks in office.

## 2018-10-30 ENCOUNTER — Encounter: Payer: Self-pay | Admitting: Internal Medicine

## 2018-11-29 ENCOUNTER — Other Ambulatory Visit: Payer: Self-pay | Admitting: Family Medicine

## 2018-12-04 ENCOUNTER — Encounter: Payer: Self-pay | Admitting: Internal Medicine

## 2018-12-04 ENCOUNTER — Other Ambulatory Visit: Payer: Self-pay

## 2018-12-04 ENCOUNTER — Ambulatory Visit: Payer: BC Managed Care – PPO

## 2018-12-04 VITALS — Ht 71.0 in | Wt 240.0 lb

## 2018-12-04 DIAGNOSIS — Z86010 Personal history of colonic polyps: Secondary | ICD-10-CM

## 2018-12-04 NOTE — Progress Notes (Signed)
No egg or soy allergy known to patient  No issues with past sedation with any surgeries  or procedures, no intubation problems  No diet pills per patient No home 02 use per patient  No blood thinners per patient  Pt denies issues with constipation  No A fib or A flutter  EMMI video sent to pt's e mail  

## 2018-12-09 ENCOUNTER — Telehealth: Payer: Self-pay | Admitting: Internal Medicine

## 2018-12-09 NOTE — Telephone Encounter (Signed)
Patient called to see when he can pick up prep from pharmacy?

## 2018-12-09 NOTE — Telephone Encounter (Signed)
Called pt- no answer- LM that all prep is over the counter- LM  Is  238 grams Miralax powder, 1 box dulcolax 5 mg laxative tablets , and a 64 oz Gatorade , no red or purple.  LM to return call with any further questions at [phone]- informed pt that he will get all this infromation in the packet that was mailed to him  Lelan Pons PV

## 2018-12-11 ENCOUNTER — Telehealth: Payer: Self-pay | Admitting: Internal Medicine

## 2018-12-11 NOTE — Telephone Encounter (Signed)

## 2018-12-12 ENCOUNTER — Encounter: Payer: Self-pay | Admitting: Internal Medicine

## 2018-12-12 ENCOUNTER — Other Ambulatory Visit: Payer: Self-pay

## 2018-12-12 ENCOUNTER — Ambulatory Visit (AMBULATORY_SURGERY_CENTER): Payer: BC Managed Care – PPO | Admitting: Internal Medicine

## 2018-12-12 VITALS — BP 120/73 | HR 52 | Temp 98.7°F | Resp 18 | Ht 71.0 in | Wt 246.0 lb

## 2018-12-12 DIAGNOSIS — Z86010 Personal history of colonic polyps: Secondary | ICD-10-CM

## 2018-12-12 DIAGNOSIS — D120 Benign neoplasm of cecum: Secondary | ICD-10-CM

## 2018-12-12 MED ORDER — SODIUM CHLORIDE 0.9 % IV SOLN: 500.0000 mL | Freq: Once | INTRAVENOUS | Status: DC

## 2018-12-12 NOTE — Patient Instructions (Addendum)
I found and removed one tiny polyp. I will let you know pathology results and when to have another routine colonoscopy by mail and/or My Chart.  I appreciate the opportunity to care for you. Gatha Mayer, MD, [facility]  Polyp handout given to patient.  Resume previous diet. Continue present medications.  YOU HAD AN ENDOSCOPIC PROCEDURE TODAY AT [facility]:   Refer to the procedure report that was given to you for any specific questions about what was found during the examination.  If the procedure report does not answer your questions, please call your gastroenterologist to clarify.  If you requested that your care partner not be given the details of your procedure findings, then the procedure report has been included in a sealed envelope for you to review at your convenience later.  YOU SHOULD EXPECT: Some feelings of bloating in the abdomen. Passage of more gas than usual.  Walking can help get rid of the air that was put into your GI tract during the procedure and reduce the bloating. If you had a lower endoscopy (such as a colonoscopy or flexible sigmoidoscopy) you may notice spotting of blood in your stool or on the toilet paper. If you underwent a bowel prep for your procedure, you may not have a normal bowel movement for a few days.  Please Note:  You might notice some irritation and congestion in your nose or some drainage.  This is from the oxygen used during your procedure.  There is no need for concern and it should clear up in a day or so.  SYMPTOMS TO REPORT IMMEDIATELY:   Following lower endoscopy (colonoscopy or flexible sigmoidoscopy):  Excessive amounts of blood in the stool  Significant tenderness or worsening of abdominal pains  Swelling of the abdomen that is new, acute  Fever of 100F or higher For urgent or emergent issues, a gastroenterologist can be reached at any hour by calling [phone].   DIET:  We do recommend a small meal at  first, but then you may proceed to your regular diet.  Drink plenty of fluids but you should avoid alcoholic beverages for 24 hours.  ACTIVITY:  You should plan to take it easy for the rest of today and you should NOT DRIVE or use heavy machinery until tomorrow (because of the sedation medicines used during the test).    FOLLOW UP: Our staff will call the number listed on your records 48-72 hours following your procedure to check on you and address any questions or concerns that you may have regarding the information given to you following your procedure. If we do not reach you, we will leave a message.  We will attempt to reach you two times.  During this call, we will ask if you have developed any symptoms of COVID 19. If you develop any symptoms (ie: fever, flu-like symptoms, shortness of breath, cough etc.) before then, please call [phone].  If you test positive for Covid 19 in the 2 weeks post procedure, please call and report this information to [place].    If any biopsies were taken you will be contacted by phone or by letter within the next 1-3 weeks.  Please call us at [phone] if you have not heard about the biopsies in 3 weeks.    SIGNATURES/CONFIDENTIALITY: You and/or your care partner have signed paperwork which will be entered into your electronic medical record.  These signatures attest to the fact that that the information above on  your After Visit Summary has been reviewed and is understood.  Full responsibility of the confidentiality of this discharge information lies with you and/or your care-partner. 

## 2018-12-12 NOTE — Progress Notes (Signed)
Pt's states no medical or surgical changes since previsit or office visit.  Covid questions C. [place], VS J Branson  

## 2018-12-12 NOTE — Op Note (Signed)
[facility] Patient Name: Calvin Lin Procedure Date: 12/12/2018 1:31 PM MRN: [id number] Endoscopist: Gatha Mayer , MD Age: 58 Referring MD:  Date of Birth: [birth year] Gender: Male Account #: [id number] Procedure:                Colonoscopy Indications:              Surveillance: Personal history of adenomatous                            polyps on last colonoscopy 5 years ago Medicines:                Propofol per Anesthesia, Monitored Anesthesia Care Procedure:                Pre-Anesthesia Assessment:                           - Prior to the procedure, a History and Physical                            was performed, and patient medications and                            allergies were reviewed. The patient's tolerance of                            previous anesthesia was also reviewed. The risks                            and benefits of the procedure and the sedation                            options and risks were discussed with the patient.                            All questions were answered, and informed consent                            was obtained. Prior Anticoagulants: The patient has                            taken no previous anticoagulant or antiplatelet                            agents. ASA Grade Assessment: II - A patient with                            mild systemic disease. After reviewing the risks                            and benefits, the patient was deemed in                            satisfactory condition to undergo the procedure.  After obtaining informed consent, the colonoscope                            was passed under direct vision. Throughout the                            procedure, the patient's blood pressure, pulse, and                            oxygen saturations were monitored continuously. The                            Model CF-HQ190L [id number]) scope was introduced   through the anus and advanced to the the cecum,                            identified by appendiceal orifice and ileocecal                            valve. The colonoscopy was performed without                            difficulty. The patient tolerated the procedure                            well. The quality of the bowel preparation was                            excellent. The ileocecal valve, appendiceal                            orifice, and rectum were photographed. The bowel                            preparation used was Miralax via split dose                            instruction. Scope In: 1:46:13 PM Scope Out: 2:03:22 PM Scope Withdrawal Time: 0 hours 13 minutes 52 seconds  Total Procedure Duration: 0 hours 17 minutes 9 seconds  Findings:                 The perianal and digital rectal examinations were                            normal. Pertinent negatives include normal prostate                            (size, shape, and consistency).                           A diminutive polyp was found in the cecum. The                            polyp was sessile. The polyp was removed with a  cold snare. Resection and retrieval were complete.                            Verification of patient identification for the                            specimen was done. Estimated blood loss was minimal.                           The exam was otherwise without abnormality on                            direct and retroflexion views. Complications:            No immediate complications. Estimated Blood Loss:     Estimated blood loss was minimal. Impression:               - One diminutive polyp in the cecum, removed with a                            cold snare. Resected and retrieved.                           - The examination was otherwise normal on direct                            and retroflexion views.                           - Personal history of colonic  polyps. 3 adenomas                            2012, 1 adenoma 2015 Recommendation:           - Patient has a contact number available for                            emergencies. The signs and symptoms of potential                            delayed complications were discussed with the                            patient. Return to normal activities tomorrow.                            Written discharge instructions were provided to the                            patient.                           - Resume previous diet.                           - Continue present medications.                           -  Repeat colonoscopy is recommended for                            surveillance. The colonoscopy date will be                            determined after pathology results from today's                            exam become available for review. Gatha Mayer, MD 12/12/2018 2:09:10 PM This report has been signed electronically.

## 2018-12-12 NOTE — Progress Notes (Signed)
Called to room to assist during endoscopic procedure.  Patient ID and intended procedure confirmed with present staff. Received instructions for my participation in the procedure from the performing physician.  

## 2018-12-12 NOTE — Progress Notes (Signed)
To PACU, VSS. Report to [email or web address] 

## 2018-12-16 ENCOUNTER — Telehealth: Payer: Self-pay

## 2018-12-16 NOTE — Telephone Encounter (Signed)
  Follow up Call-  Call back number 12/12/2018  Post procedure Call Back phone  # [phone]  Permission to leave phone message Yes  Some recent data might be hidden     Patient questions:  Do you have a fever, pain , or abdominal swelling? No. Pain Score  0 *  Have you tolerated food without any problems? Yes.    Have you been able to return to your normal activities? Yes.    Do you have any questions about your discharge instructions: Diet   No. Medications  No. Follow up visit  No.  Do you have questions or concerns about your Care? No.  Actions: * If pain score is 4 or above: No action needed, pain <4.  1. Have you developed a fever since your procedure? no  2.   Have you had an respiratory symptoms (SOB or cough) since your procedure? no  3.   Have you tested positive for COVID 19 since your procedure no  4.   Have you had any family members/close contacts diagnosed with the COVID 19 since your procedure? no  If yes to any of these questions please route to Joylene John, RN and Alphonsa Gin, Therapist, sports.

## 2018-12-20 ENCOUNTER — Encounter: Payer: Self-pay | Admitting: Internal Medicine

## 2018-12-20 NOTE — Progress Notes (Signed)
Adenoma recall 2025

## 2019-02-24 ENCOUNTER — Other Ambulatory Visit: Payer: Self-pay | Admitting: Family Medicine

## 2021-05-02 ENCOUNTER — Other Ambulatory Visit: Payer: Self-pay

## 2021-05-02 ENCOUNTER — Encounter: Payer: Self-pay | Admitting: Family Medicine

## 2021-05-02 ENCOUNTER — Ambulatory Visit: Payer: 59 | Admitting: Family Medicine

## 2021-05-02 VITALS — BP 113/74 | HR 60 | Temp 98.0°F | Ht 71.0 in | Wt 242.0 lb

## 2021-05-02 DIAGNOSIS — H6123 Impacted cerumen, bilateral: Secondary | ICD-10-CM | POA: Diagnosis not present

## 2021-05-02 DIAGNOSIS — R439 Unspecified disturbances of smell and taste: Secondary | ICD-10-CM

## 2021-05-02 DIAGNOSIS — J0140 Acute pansinusitis, unspecified: Secondary | ICD-10-CM | POA: Diagnosis not present

## 2021-05-02 MED ORDER — FLUTICASONE PROPIONATE 50 MCG/ACT NA SUSP: 2.0000 | Freq: Every day | NASAL | 6 refills | Status: DC

## 2021-05-02 MED ORDER — AMOXICILLIN-POT CLAVULANATE 875-125 MG PO TABS: 1.0000 | ORAL_TABLET | Freq: Two times a day (BID) | ORAL | 0 refills | Status: AC

## 2021-05-02 MED ORDER — DEBROX 6.5 % OT SOLN: 5.0000 [drp] | Freq: Every evening | OTIC | 0 refills | Status: DC

## 2021-05-02 NOTE — Progress Notes (Signed)
Subjective:  Patient ID: Calvin Lin, male    DOB: 07-15-1960, 60 y.o.   MRN: [id number]  Patient Care Team: Jonathon Jordan, MD as PCP - General (Family Medicine)   Chief Complaint:  New Patient (Initial Visit) (Odd smell coming through nose (rotten egg smell))   HPI: Calvin Lin is a 60 y.o. male presenting on 05/02/2021 for New Patient (Initial Visit) (Odd smell coming through nose (rotten egg smell))   Pt presents today to establish care with new PCP but specifically for evaluation of rotten egg smell in nose constantly for the last 3 weeks. He has not tried anything for the symptoms. He states he is concerned because the smell is not going away. He does have some nasal and ear congestion but denies all other symptoms.     Relevant past medical, surgical, family, and social history reviewed and updated as indicated.  Allergies and medications reviewed and updated. Data reviewed: Chart in Epic.   Past Medical History:  Diagnosis Date   Arthritis    r ankle   Asthma    Blood transfusion without reported diagnosis    Cataract    Personal history of colonic polyps - adenomas 09/13/2010   Sleep apnea     Past Surgical History:  Procedure Laterality Date   ANKLE FRACTURE SURGERY     CARPAL TUNNEL RELEASE     right   COLONOSCOPY     vein repair r flank      Social History   Socioeconomic History   Marital status: Married    Spouse name: Not on file   Number of children: Not on file   Years of education: Not on file   Highest education level: Not on file  Occupational History   Occupation: POLICE OFFICER    Employer: [employer]  Tobacco Use   Smoking status: Never   Smokeless tobacco: Never  Vaping Use   Vaping Use: Never used  Substance and Sexual Activity   Alcohol use: Yes    Alcohol/week: 1.0 standard drink    Types: 1 Cans of beer per week   Drug use: No   Sexual activity: Not on file  Other Topics Concern   Not on file  Social  History Narrative   Not on file   Social Determinants of Health   Financial Resource Strain: Not on file  Food Insecurity: Not on file  Transportation Needs: Not on file  Physical Activity: Not on file  Stress: Not on file  Social Connections: Not on file  Intimate Partner Violence: Not on file    Outpatient Encounter Medications as of 05/02/2021  Medication Sig   amoxicillin-clavulanate (AUGMENTIN) 875-125 MG tablet Take 1 tablet by mouth 2 (two) times daily for 10 days.   carbamide peroxide (DEBROX) 6.5 % OTIC solution Place 5 drops into both ears at bedtime.   COLLAGEN PO Take by mouth.   fluticasone (FLONASE) 50 MCG/ACT nasal spray Place 2 sprays into both nostrils daily.   vitamin C (ASCORBIC ACID) 500 MG tablet Take 500 mg by mouth daily.   [DISCONTINUED] Diclofenac Sodium 2 % SOLN Place 2 g onto the skin 2 (two) times daily. (Patient not taking: Reported on 12/04/2018)   [DISCONTINUED] triamcinolone cream (KENALOG) 0.1 % Apply 1 application topically 2 (two) times daily. (Patient not taking: Reported on 12/12/2018)   [DISCONTINUED] Turmeric (QC TUMERIC COMPLEX) 500 MG CAPS Take 500 mg by mouth daily. Takes 1000 mg once a day   [DISCONTINUED]  Vitamin D, Ergocalciferol, (DRISDOL) 1.25 MG (50000 UT) CAPS capsule TAKE 1 CAPSULE (50,000 UNITS TOTAL) BY MOUTH EVERY 7 (SEVEN) DAYS.   Facility-Administered Encounter Medications as of 05/02/2021  Medication   0.9 %  sodium chloride infusion    No Known Allergies  Review of Systems  Constitutional:  Negative for activity change, appetite change, chills, diaphoresis, fatigue, fever and unexpected weight change.  HENT:  Positive for congestion. Negative for dental problem, drooling, ear discharge, ear pain, facial swelling, hearing loss, mouth sores, nosebleeds, postnasal drip, rhinorrhea, sinus pressure, sinus pain, sneezing, sore throat, tinnitus, trouble swallowing and voice change.   Eyes: Negative.   Respiratory:  Negative for  cough, chest tightness and shortness of breath.   Cardiovascular:  Negative for chest pain, palpitations and leg swelling.  Gastrointestinal:  Negative for abdominal pain, blood in stool, constipation, diarrhea, nausea and vomiting.  Endocrine: Negative.   Genitourinary:  Negative for decreased urine volume, difficulty urinating, dysuria, frequency and urgency.  Musculoskeletal:  Negative for arthralgias and myalgias.  Skin: Negative.   Allergic/Immunologic: Negative.   Neurological:  Negative for dizziness, tremors, seizures, syncope, facial asymmetry, speech difficulty, weakness, light-headedness, numbness and headaches.  Hematological: Negative.   Psychiatric/Behavioral:  Negative for confusion, hallucinations, sleep disturbance and suicidal ideas.   All other systems reviewed and are negative.      Objective:  BP 113/74   Pulse 60   Temp 98 F (36.7 C)   Ht 5\' 11"  (1.803 m)   Wt 242 lb (109.8 kg)   SpO2 99%   BMI 33.75 kg/m    Wt Readings from Last 3 Encounters:  05/02/21 242 lb (109.8 kg)  12/12/18 246 lb (111.6 kg)  12/04/18 240 lb (108.9 kg)    Physical Exam Vitals and nursing note reviewed.  Constitutional:      General: He is not in acute distress.    Appearance: Normal appearance. He is well-developed and well-groomed. He is obese. He is not ill-appearing, toxic-appearing or diaphoretic.  HENT:     Head: Normocephalic and atraumatic.     Jaw: There is normal jaw occlusion.     Right Ear: Hearing normal. There is impacted cerumen.     Left Ear: Hearing normal. There is impacted cerumen.     Nose: Rhinorrhea present. No nasal deformity, septal deviation, signs of injury, laceration, nasal tenderness or congestion. Rhinorrhea is purulent.     Right Turbinates: Enlarged.     Left Turbinates: Enlarged.     Right Sinus: No maxillary sinus tenderness or frontal sinus tenderness.     Left Sinus: No maxillary sinus tenderness or frontal sinus tenderness.      Mouth/Throat:     Lips: Pink.     Mouth: Mucous membranes are moist.     Pharynx: Oropharynx is clear. Uvula midline. Posterior oropharyngeal erythema present. No pharyngeal swelling, oropharyngeal exudate or uvula swelling.     Tonsils: No tonsillar exudate or tonsillar abscesses.  Eyes:     General: Lids are normal.     Extraocular Movements: Extraocular movements intact.     Conjunctiva/sclera: Conjunctivae normal.     Pupils: Pupils are equal, round, and reactive to light.  Neck:     Thyroid: No thyroid mass, thyromegaly or thyroid tenderness.     Vascular: No carotid bruit or JVD.     Trachea: Trachea and phonation normal.  Cardiovascular:     Rate and Rhythm: Normal rate and regular rhythm.     Chest Wall: PMI is not  displaced.     Pulses: Normal pulses.     Heart sounds: Normal heart sounds. No murmur heard.   No friction rub. No gallop.  Pulmonary:     Effort: Pulmonary effort is normal. No respiratory distress.     Breath sounds: Normal breath sounds. No wheezing.  Abdominal:     General: There is no abdominal bruit.     Palpations: There is no hepatomegaly or splenomegaly.  Musculoskeletal:        General: Normal range of motion.     Cervical back: Normal range of motion and neck supple.     Right lower leg: No edema.     Left lower leg: No edema.  Lymphadenopathy:     Cervical: No cervical adenopathy.  Skin:    General: Skin is warm and dry.     Capillary Refill: Capillary refill takes less than 2 seconds.     Coloration: Skin is not cyanotic, jaundiced or pale.     Findings: No rash.  Neurological:     General: No focal deficit present.     Mental Status: He is alert and oriented to person, place, and time.     Sensory: Sensation is intact.     Motor: Motor function is intact.     Coordination: Coordination is intact.     Gait: Gait is intact.     Deep Tendon Reflexes: Reflexes are normal and symmetric.  Psychiatric:        Attention and Perception:  Attention and perception normal.        Mood and Affect: Mood and affect normal.        Speech: Speech normal.        Behavior: Behavior normal. Behavior is cooperative.        Thought Content: Thought content normal.        Cognition and Memory: Cognition and memory normal.        Judgment: Judgment normal.    Results for orders placed or performed in visit on 09/09/18  Testosterone  Result Value Ref Range   Testosterone 204.65 (L) 300.00 - 890.00 ng/dL  PTH, intact and calcium  Result Value Ref Range   PTH 9 (L) 14 - 64 pg/mL   Calcium 10.1 8.6 - 10.3 mg/dL  TSH  Result Value Ref Range   TSH 1.62 0.35 - 4.50 uIU/mL  Ferritin  Result Value Ref Range   Ferritin 59.9 22.0 - 322.0 ng/mL  IBC panel  Result Value Ref Range   Iron 129 42 - 165 ug/dL   Transferrin 287.0 212.0 - 360.0 mg/dL   Saturation Ratios 32.1 20.0 - 50.0 %  VITAMIN D 25 Hydroxy (Vit-D Deficiency, Fractures)  Result Value Ref Range   VITD 25.37 (L) 30.00 - 100.00 ng/mL  Sedimentation rate  Result Value Ref Range   Sed Rate 8 0 - 20 mm/hr       Pertinent labs & imaging results that were available during my care of the patient were reviewed by me and considered in my medical decision making.  Assessment & Plan:  Kyshaun was seen today for new patient (initial visit).  Diagnoses and all orders for this visit:  Smell disturbance Acute non-recurrent pansinusitis Nasal congestion, postnasal drip and rhinorrhea with abnormal smell for over three weeks. Will initiate Flonase and Augmentin as below. Pt aware to report continued symptoms. Will refer to ENT if not improving.  -     amoxicillin-clavulanate (AUGMENTIN) 875-125 MG tablet; Take 1 tablet by  mouth 2 (two) times daily for 10 days. -     fluticasone (FLONASE) 50 MCG/ACT nasal spray; Place 2 sprays into both nostrils daily.  Bilateral impacted cerumen Declined irrigation in office. Debrox nightly.  -     carbamide peroxide (DEBROX) 6.5 % OTIC solution;  Place 5 drops into both ears at bedtime.    Continue all other maintenance medications.  Follow up plan: Return in about 3 months (around 08/02/2021), or if symptoms worsen or fail to improve, for CPE.   Continue healthy lifestyle choices, including diet (rich in fruits, vegetables, and lean proteins, and low in salt and simple carbohydrates) and exercise (at least 30 minutes of moderate physical activity daily).  Educational handout given for sinusitis  The above assessment and management plan was discussed with the patient. The patient verbalized understanding of and has agreed to the management plan. Patient is aware to call the clinic if they develop any new symptoms or if symptoms persist or worsen. Patient is aware when to return to the clinic for a follow-up visit. Patient educated on when it is appropriate to go to the emergency department.   Monia Pouch, FNP-C [facility] [phone]

## 2021-05-17 ENCOUNTER — Ambulatory Visit: Payer: 59 | Admitting: Family Medicine

## 2021-08-03 ENCOUNTER — Encounter: Payer: 59 | Admitting: Family Medicine

## 2021-08-18 ENCOUNTER — Encounter: Payer: Self-pay | Admitting: Family Medicine

## 2021-08-18 ENCOUNTER — Ambulatory Visit (INDEPENDENT_AMBULATORY_CARE_PROVIDER_SITE_OTHER): Payer: No Typology Code available for payment source | Admitting: Family Medicine

## 2021-08-18 VITALS — BP 111/60 | HR 59 | Temp 97.9°F | Ht 71.0 in | Wt 241.4 lb

## 2021-08-18 DIAGNOSIS — Z114 Encounter for screening for human immunodeficiency virus [HIV]: Secondary | ICD-10-CM

## 2021-08-18 DIAGNOSIS — Z130 Encounter for screening for diseases of the blood and blood-forming organs and certain disorders involving the immune mechanism: Secondary | ICD-10-CM | POA: Diagnosis not present

## 2021-08-18 DIAGNOSIS — Z0001 Encounter for general adult medical examination with abnormal findings: Secondary | ICD-10-CM | POA: Diagnosis not present

## 2021-08-18 DIAGNOSIS — Z125 Encounter for screening for malignant neoplasm of prostate: Secondary | ICD-10-CM

## 2021-08-18 DIAGNOSIS — Z13220 Encounter for screening for lipoid disorders: Secondary | ICD-10-CM

## 2021-08-18 DIAGNOSIS — Z1329 Encounter for screening for other suspected endocrine disorder: Secondary | ICD-10-CM

## 2021-08-18 DIAGNOSIS — R202 Paresthesia of skin: Secondary | ICD-10-CM

## 2021-08-18 DIAGNOSIS — Z1159 Encounter for screening for other viral diseases: Secondary | ICD-10-CM

## 2021-08-18 DIAGNOSIS — Z0000 Encounter for general adult medical examination without abnormal findings: Secondary | ICD-10-CM

## 2021-08-18 NOTE — Progress Notes (Signed)
? ?Calvin Lin is a 61 y.o. male presents to office today for annual physical exam examination.   ? ?Concerns today include: ?1. Bilateral lower extremity numbness and tingling, worsening over last 6 months.  ? ?Occupation: [employer], Marital status: married, Substance use: denies ?Diet: generally healthy, Exercise: regular ?Last eye exam: 2022 ?Last dental exam: 2023 ?Last colonoscopy: 2020 ?Immunizations needed: Flu Vaccine: declined  ?Tdap Vaccine: up to date  ?Zoster Vaccine: states completed  ? ?Past Medical History:  ?Diagnosis Date  ? Arthritis   ? r ankle  ? Asthma   ? Blood transfusion without reported diagnosis   ? Cataract   ? Personal history of colonic polyps - adenomas 09/13/2010  ? Sleep apnea   ? ?Social History  ? ?Socioeconomic History  ? Marital status: Married  ?  Spouse name: Not on file  ? Number of children: Not on file  ? Years of education: Not on file  ? Highest education level: Not on file  ?Occupational History  ? Occupation: POLICE OFFICER  ?  Employer: [employer]  ?Tobacco Use  ? Smoking status: Never  ? Smokeless tobacco: Never  ?Vaping Use  ? Vaping Use: Never used  ?Substance and Sexual Activity  ? Alcohol use: Yes  ?  Alcohol/week: 1.0 standard drink  ?  Types: 1 Cans of beer per week  ? Drug use: No  ? Sexual activity: Not on file  ?Other Topics Concern  ? Not on file  ?Social History Narrative  ? Not on file  ? ?Social Determinants of Health  ? ?Financial Resource Strain: Not on file  ?Food Insecurity: Not on file  ?Transportation Needs: Not on file  ?Physical Activity: Not on file  ?Stress: Not on file  ?Social Connections: Not on file  ?Intimate Partner Violence: Not on file  ? ?Past Surgical History:  ?Procedure Laterality Date  ? ANKLE FRACTURE SURGERY    ? CARPAL TUNNEL RELEASE    ? right  ? COLONOSCOPY    ? vein repair r flank    ? ?Family History  ?Problem Relation Age of Onset  ? Esophageal cancer Father   ? Stomach cancer Father   ? Colon cancer Maternal Uncle   ?  Rectal cancer Neg Hx   ? ? ?Current Outpatient Medications:  ?  Multiple Vitamins-Minerals (ZINC PO), Take by mouth., Disp: , Rfl:  ?  vitamin C (ASCORBIC ACID) 500 MG tablet, Take 500 mg by mouth daily., Disp: , Rfl:  ? ?Current Facility-Administered Medications:  ?  0.9 %  sodium chloride infusion, 500 mL, Intravenous, Once, Gatha Mayer, MD ? ?Allergies  ?Allergen Reactions  ? Prednisone Other (See Comments)  ?  Irritability, "out of body sensation". Able to tolerate injection  ?  ?Review of Systems  ?Neurological:  Positive for tingling (bilateral lower legs and feet).  ?All other systems reviewed and are negative. ? ? ?BP 111/60   Pulse (!) 59   Temp 97.9 ?F (36.6 ?C) (Temporal)   Ht _0  (1.803 m)   Wt 241 lb 6 oz (109.5 kg)   BMI 33.66 kg/m?  ? ?Physical Exam ?Vitals and nursing note reviewed.  ?Constitutional:   ?   General: He is not in acute distress. ?   Appearance: Normal appearance. He is obese. He is not ill-appearing, toxic-appearing or diaphoretic.  ?HENT:  ?   Head: Normocephalic and atraumatic.  ?   Right Ear: Tympanic membrane, ear canal and external ear normal.  ?  Left Ear: Tympanic membrane, ear canal and external ear normal.  ?   Nose: Nose normal.  ?   Mouth/Throat:  ?   Lips: Pink.  ?   Mouth: Mucous membranes are moist.  ?   Dentition: Dental caries present.  ?   Pharynx: Oropharynx is clear.  ?Eyes:  ?   Conjunctiva/sclera: Conjunctivae normal.  ?   Pupils: Pupils are equal, round, and reactive to light.  ?Neck:  ?   Vascular: No carotid bruit.  ?Cardiovascular:  ?   Rate and Rhythm: Normal rate and regular rhythm.  ?   Heart sounds: Normal heart sounds. No murmur heard. ?  No friction rub. No gallop.  ?Pulmonary:  ?   Effort: Pulmonary effort is normal. No respiratory distress.  ?   Breath sounds: Normal breath sounds. No stridor. No wheezing, rhonchi or rales.  ?Chest:  ?   Chest wall: No tenderness.  ?Abdominal:  ?   General: Bowel sounds are normal. There is no distension.   ?   Palpations: Abdomen is soft. There is no mass.  ?   Tenderness: There is no abdominal tenderness. There is no right CVA tenderness, left CVA tenderness, guarding or rebound.  ?   Hernia: No hernia is present.  ?Musculoskeletal:     ?   General: Normal range of motion.  ?   Cervical back: Normal range of motion and neck supple.  ?   Right lower leg: No edema.  ?   Left lower leg: No edema.  ?Skin: ?   General: Skin is warm and dry.  ?   Capillary Refill: Capillary refill takes less than 2 seconds.  ?Neurological:  ?   General: No focal deficit present.  ?   Mental Status: He is alert and oriented to person, place, and time.  ?   Cranial Nerves: No cranial nerve deficit.  ?   Sensory: No sensory deficit.  ?   Motor: No weakness.  ?   Coordination: Coordination normal.  ?   Gait: Gait normal.  ?   Deep Tendon Reflexes: Reflexes normal.  ?Psychiatric:     ?   Mood and Affect: Mood normal.     ?   Behavior: Behavior normal.     ?   Thought Content: Thought content normal.     ?   Judgment: Judgment normal.  ? ? ? ?Assessment/ Plan: ?Calvin Lin was seen today for annual exam. ? ?Diagnoses and all orders for this visit: ? ?Annual physical exam ?Health maintenance discussed in detail. Will obtain labs and screen for below disorders. Colonoscopy is UTD. Declined vaccinations today.  ?-     CMP14+EGFR ?-     CBC with Differential/Platelet ?-     HIV Antibody (routine testing w rflx) ?-     Hepatitis C antibody ?-     Vitamin B12 ?-     Lipid panel ?-     Thyroid Panel With TSH ?-     VITAMIN D 25 Hydroxy (Vit-D Deficiency, Fractures) ?-     Magnesium ?-     PSA, total and free ? ?Screening for lipid disorders ?-     Lipid panel ? ?Screening for endocrine disorder ?-     CMP14+EGFR ?-     Thyroid Panel With TSH ? ?Screening for deficiency anemia ?-     CBC with Differential/Platelet ?-     Vitamin B12 ? ?Screening for prostate cancer ?-     PSA, total and  free ? ?Screening for HIV (human immunodeficiency virus) ?-     HIV  Antibody (routine testing w rflx) ? ?Encounter for hepatitis C screening test for low risk patient ?-     Hepatitis C antibody ? ?Paresthesia of bilateral legs ?Ongoing and worsening over the last several months. Will check for potential underlying causes and treat deficiencies if present. If labs normal, will discuss possible medications.  ?-     CMP14+EGFR ?-     CBC with Differential/Platelet ?-     Vitamin B12 ?-     VITAMIN D 25 Hydroxy (Vit-D Deficiency, Fractures) ?-     Magnesium ? ? ? ? ? ?Counseled on healthy lifestyle choices, including diet (rich in fruits, vegetables and lean meats and low in salt and simple carbohydrates) and exercise (at least 30 minutes of moderate physical activity daily). ? ?Patient to follow up in 1 year for annual exam or sooner if needed. ? ?The above assessment and management plan was discussed with the patient. The patient verbalized understanding of and has agreed to the management plan. Patient is aware to call the clinic if symptoms persist or worsen. Patient is aware when to return to the clinic for a follow-up visit. Patient educated on when it is appropriate to go to the emergency department.  ? ?Monia Pouch, FNP-C ?[facility] ?[address] ?[place], [place] [address] ?[phone] ? ? ? ? ? ? ?

## 2021-08-19 LAB — LIPID PANEL
Chol/HDL Ratio: 4.3 ratio (ref 0.0–5.0)
Cholesterol, Total: 146 mg/dL (ref 100–199)
HDL: 34 mg/dL — ABNORMAL LOW (ref >39)
LDL Chol Calc (NIH): 90 mg/dL (ref 0–99)
Triglycerides: 122 mg/dL (ref 0–149)
VLDL Cholesterol Cal: 22 mg/dL (ref 5–40)

## 2021-08-19 LAB — CMP14+EGFR
ALT: 39 IU/L (ref 0–44)
AST: 26 IU/L (ref 0–40)
Albumin/Globulin Ratio: 1.4 (ref 1.2–2.2)
Albumin: 4.3 g/dL (ref 3.8–4.8)
Alkaline Phosphatase: 53 IU/L (ref 44–121)
BUN/Creatinine Ratio: 15 (ref 10–24)
BUN: 12 mg/dL (ref 8–27)
Bilirubin Total: 0.6 mg/dL (ref 0.0–1.2)
CO2: 23 mmol/L (ref 20–29)
Calcium: 9.9 mg/dL (ref 8.6–10.2)
Chloride: 102 mmol/L (ref 96–106)
Creatinine, Ser: 0.82 mg/dL (ref 0.76–1.27)
Globulin, Total: 3.1 g/dL (ref 1.5–4.5)
Glucose: 104 mg/dL — ABNORMAL HIGH (ref 70–99)
Potassium: 4.2 mmol/L (ref 3.5–5.2)
Sodium: 137 mmol/L (ref 134–144)
Total Protein: 7.4 g/dL (ref 6.0–8.5)
eGFR: 99 mL/min/{1.73_m2} (ref >59)

## 2021-08-19 LAB — CBC WITH DIFFERENTIAL/PLATELET
Basophils Absolute: 0.1 10*3/uL (ref 0.0–0.2)
Basos: 1 %
EOS (ABSOLUTE): 0.2 10*3/uL (ref 0.0–0.4)
Eos: 4 %
Hematocrit: 42 % (ref 37.5–51.0)
Hemoglobin: 14.5 g/dL (ref 13.0–17.7)
Immature Grans (Abs): 0 10*3/uL (ref 0.0–0.1)
Immature Granulocytes: 1 %
Lymphocytes Absolute: 1.7 10*3/uL (ref 0.7–3.1)
Lymphs: 30 %
MCH: 30 pg (ref 26.6–33.0)
MCHC: 34.5 g/dL (ref 31.5–35.7)
MCV: 87 fL (ref 79–97)
Monocytes Absolute: 0.6 10*3/uL (ref 0.1–0.9)
Monocytes: 10 %
Neutrophils Absolute: 3.2 10*3/uL (ref 1.4–7.0)
Neutrophils: 54 %
Platelets: 246 10*3/uL (ref 150–450)
RBC: 4.84 x10E6/uL (ref 4.14–5.80)
RDW: 12.9 % (ref 11.6–15.4)
WBC: 5.8 10*3/uL (ref 3.4–10.8)

## 2021-08-19 LAB — THYROID PANEL WITH TSH
Free Thyroxine Index: 1.8 (ref 1.2–4.9)
T3 Uptake Ratio: 28 % (ref 24–39)
T4, Total: 6.5 ug/dL (ref 4.5–12.0)
TSH: 1.81 u[IU]/mL (ref 0.450–4.500)

## 2021-08-19 LAB — PSA, TOTAL AND FREE
PSA, Free Pct: 20 %
PSA, Free: 0.04 ng/mL
Prostate Specific Ag, Serum: 0.2 ng/mL (ref 0.0–4.0)

## 2021-08-19 LAB — VITAMIN B12: Vitamin B-12: 265 pg/mL (ref 232–1245)

## 2021-08-19 LAB — HIV ANTIBODY (ROUTINE TESTING W REFLEX): HIV Screen 4th Generation wRfx: NONREACTIVE

## 2021-08-19 LAB — HEPATITIS C ANTIBODY: Hep C Virus Ab: NONREACTIVE

## 2021-08-19 LAB — VITAMIN D 25 HYDROXY (VIT D DEFICIENCY, FRACTURES): Vit D, 25-Hydroxy: 50.6 ng/mL (ref 30.0–100.0)

## 2021-08-19 LAB — MAGNESIUM: Magnesium: 2 mg/dL (ref 1.6–2.3)

## 2021-08-23 NOTE — Progress Notes (Signed)
PT R/C 

## 2021-08-24 ENCOUNTER — Telehealth: Payer: Self-pay | Admitting: Family Medicine

## 2021-08-24 NOTE — Progress Notes (Signed)
Patient returning call about labs. He will be available until 35, then he will be going back to work. If he does not answer please leave results on voicemail.  ?

## 2022-01-01 ENCOUNTER — Encounter: Payer: Self-pay | Admitting: Nurse Practitioner

## 2022-01-01 ENCOUNTER — Ambulatory Visit (INDEPENDENT_AMBULATORY_CARE_PROVIDER_SITE_OTHER): Payer: No Typology Code available for payment source | Admitting: Nurse Practitioner

## 2022-01-01 VITALS — BP 109/66 | HR 66 | Temp 97.6°F | Resp 20 | Ht 71.0 in | Wt 250.0 lb

## 2022-01-01 DIAGNOSIS — L247 Irritant contact dermatitis due to plants, except food: Secondary | ICD-10-CM | POA: Diagnosis not present

## 2022-01-01 MED ORDER — METHYLPREDNISOLONE ACETATE 80 MG/ML IJ SUSP
80.0000 mg | Freq: Once | INTRAMUSCULAR | Status: AC
  Administered 2022-01-01: 80 mg via INTRAMUSCULAR

## 2022-01-01 MED ORDER — PREDNISONE 20 MG PO TABS: ORAL_TABLET | ORAL | 0 refills | Status: DC

## 2022-01-01 NOTE — Progress Notes (Signed)
   Subjective:    Patient ID: Calvin Lin, male    DOB: [birth year], 61 y.o.   MRN: [id number]   Chief Complaint: Rash (Poison oak rash )   Rash Pertinent negatives include no eye pain or shortness of breath.   Patient comes in today c/o rash on forearms and in between fingers . Has a few spots on his face. Very itchy    Review of Systems  Constitutional:  Negative for diaphoresis.  Eyes:  Negative for pain.  Respiratory:  Negative for shortness of breath.   Cardiovascular:  Negative for chest pain, palpitations and leg swelling.  Gastrointestinal:  Negative for abdominal pain.  Endocrine: Negative for polydipsia.  Skin:  Positive for rash.  Neurological:  Negative for dizziness, weakness and headaches.  Hematological:  Does not bruise/bleed easily.  All other systems reviewed and are negative.      Objective:   Physical Exam Vitals reviewed.  Constitutional:      Appearance: Normal appearance.  Cardiovascular:     Rate and Rhythm: Regular rhythm.     Heart sounds: Normal heart sounds.  Pulmonary:     Effort: Pulmonary effort is normal.     Breath sounds: Normal breath sounds.  Skin:    General: Skin is warm.     Findings: Rash (erythematous vesicular rashin linear pattern on forearms and left cheek. has an area between fingers on right hand) present.  Neurological:     General: No focal deficit present.     Mental Status: He is alert and oriented to person, place, and time.  Psychiatric:        Mood and Affect: Mood normal.        Behavior: Behavior normal.    BP 109/66   Pulse 66   Temp 97.6 F (36.4 C)   Resp 20   Ht '5\' 11"'$  (1.803 m)   Wt 250 lb (113.4 kg)   SpO2 96%   BMI 34.87 kg/m         Assessment & Plan:  Marcelino Campos in today with chief complaint of Rash (Poison oak rash )   1. Irritant contact dermatitis due to plants, except food Avoid scratching'  benadryl OTC Clalmine lotion RTO prn  Meds ordered this encounter  Medications    methylPREDNISolone acetate (DEPO-MEDROL) injection 80 mg   predniSONE (DELTASONE) 20 MG tablet    Sig: 2 po at sametime daily for 5 days-    Dispense:  10 tablet    Refill:  0    Order Specific Question:   Supervising Provider    Answer:   Caryl Pina A [[id number]]      The above assessment and management plan was discussed with the patient. The patient verbalized understanding of and has agreed to the management plan. Patient is aware to call the clinic if symptoms persist or worsen. Patient is aware when to return to the clinic for a follow-up visit. Patient educated on when it is appropriate to go to the emergency department.   Mary-Margaret Hassell Done, FNP

## 2022-01-01 NOTE — Patient Instructions (Signed)
Poison Ivy Dermatitis Poison ivy dermatitis is inflammation of the skin that is caused by chemicals in the leaves of the poison ivy plant. The skin reaction often involves redness, swelling, blisters, and extreme itching. What are the causes? This condition is caused by a chemical (urushiol) found in the sap of the poison ivy plant. This chemical is sticky and can be easily spread to people, animals, and objects. You can get poison ivy dermatitis by: Having direct contact with a poison ivy plant. Touching animals, other people, or objects that have come in contact with poison ivy and have the chemical on them. What increases the risk? This condition is more likely to develop in people who: Are outdoors often in wooded or [place] areas. Go outdoors without wearing protective clothing, such as closed shoes, long pants, and a long-sleeved shirt. What are the signs or symptoms? Symptoms of this condition include: Redness of the skin. Extreme itching. A rash that often includes bumps and blisters. The rash usually appears 48 hours after exposure, if you have been exposed before. If this is the first time you have been exposed, the rash may not appear until a week after exposure. Swelling. This may occur if the reaction is more severe. Symptoms usually last for 1-2 weeks. However, the first time you develop this condition, symptoms may last 3-4 weeks. How is this diagnosed? This condition may be diagnosed based on your symptoms and a physical exam. Your health care provider may also ask you about any recent outdoor activity. How is this treated? Treatment for this condition will vary depending on how severe it is. Treatment may include: Hydrocortisone cream or calamine lotion to relieve itching. Oatmeal baths to soothe the skin. Medicines, such as over-the-counter antihistamine tablets. Oral steroid medicine, for more severe reactions. Follow these instructions at home: Medicines Take or apply  over-the-counter and prescription medicines only as told by your health care provider. Use hydrocortisone cream or calamine lotion as needed to soothe the skin and relieve itching. General instructions Do not scratch or rub your skin. Apply a cold, wet cloth (cold compress) to the affected areas or take baths in cool water. This will help with itching. Avoid hot baths and showers. Take oatmeal baths as needed. Use colloidal oatmeal. You can get this at your local pharmacy or grocery store. Follow the instructions on the packaging. While you have the rash, wash clothes right after you wear them. Keep all follow-up visits as told by your health care provider. This is important. How is this prevented?  Learn to identify the poison ivy plant and avoid contact with the plant. This plant can be recognized by the number of leaves. Generally, poison ivy has three leaves with flowering branches on a single stem. The leaves are typically glossy, and they have jagged edges that come to a point at the front. If you have been exposed to poison ivy, thoroughly wash with soap and water right away. You have about 30 minutes to remove the plant resin before it will cause the rash. Be sure to wash under your fingernails, because any plant resin there will continue to spread the rash. When hiking or camping, wear clothes that will help you to avoid exposure on the skin. This includes long pants, a long-sleeved shirt, tall socks, and hiking boots. You can also apply preventive lotion to your skin to help limit exposure. If you suspect that your clothes or outdoor gear came in contact with poison ivy, rinse them off outside  with a garden hose before you bring them inside your house. When doing yard work or gardening, wear gloves, long sleeves, long pants, and boots. Wash your garden tools and gloves if they come in contact with poison ivy. If you suspect that your pet has come into contact with poison ivy, wash him or her  with pet shampoo and water. Make sure to wear gloves while washing your pet. Contact a health care provider if you have: Open sores in the rash area. More redness, swelling, or pain in the affected area. Redness that spreads beyond the rash area. Fluid, blood, or pus coming from the affected area. A fever. A rash over a large area of your body. A rash on your eyes, mouth, or genitals. A rash that does not improve after a few weeks. Get help right away if: Your face swells or your eyes swell shut. You have trouble breathing. You have trouble swallowing. These symptoms may represent a serious problem that is an emergency. Do not wait to see if the symptoms will go away. Get medical help right away. Call your local emergency services (911 in the [place] Do not drive yourself to the hospital. Summary Poison ivy dermatitis is inflammation of the skin that is caused by chemicals in the leaves of the poison ivy plant. Symptoms of this condition include redness, itching, a rash, and swelling. Do not scratch or rub your skin. Take or apply over-the-counter and prescription medicines only as told by your health care provider. This information is not intended to replace advice given to you by your health care provider. Make sure you discuss any questions you have with your health care provider. Document Revised: 03/20/2021 Document Reviewed: 03/20/2021 Elsevier Patient Education  [place].

## 2022-05-02 ENCOUNTER — Ambulatory Visit (INDEPENDENT_AMBULATORY_CARE_PROVIDER_SITE_OTHER): Payer: No Typology Code available for payment source | Admitting: Family Medicine

## 2022-05-02 ENCOUNTER — Encounter: Payer: Self-pay | Admitting: Family Medicine

## 2022-05-02 VITALS — BP 132/76 | HR 60 | Temp 97.5°F | Ht 71.0 in | Wt 243.0 lb

## 2022-05-02 DIAGNOSIS — M5442 Lumbago with sciatica, left side: Secondary | ICD-10-CM

## 2022-05-02 MED ORDER — METHYLPREDNISOLONE ACETATE 80 MG/ML IJ SUSP
80.0000 mg | Freq: Once | INTRAMUSCULAR | Status: AC
  Administered 2022-05-02: 80 mg via INTRAMUSCULAR

## 2022-05-02 NOTE — Progress Notes (Signed)
Subjective:  Patient ID: Calvin Lin, male    DOB: Jul 10, 1960, 61 y.o.   MRN: [id number]  Patient Care Team: Baruch Gouty, FNP as PCP - General (Family Medicine)   Chief Complaint:  Back Pain (Lower back pain that goes down left leg x 4-5 days. Wants PT referral )   HPI: Calvin Lin is a 61 y.o. male presenting on 05/02/2022 for Back Pain (Lower back pain that goes down left leg x 4-5 days. Wants PT referral )   Pt presents today with complaints of left lower back pain that radiates into his left leg. States he was cutting trees and lifting fire wood a few days ago. States started having pain which radiates down left leg. Has been trying topicals for the pain without relief.   Back Pain This is a new problem. The current episode started in the past 7 days. The problem occurs constantly. The problem has been waxing and waning since onset. The pain is present in the lumbar spine, gluteal and sacro-iliac. The quality of the pain is described as aching, burning and shooting. The pain radiates to the left thigh. The pain is moderate. The symptoms are aggravated by twisting, stress, standing, sitting and position. Associated symptoms include leg pain and tingling. Pertinent negatives include no abdominal pain, bladder incontinence, bowel incontinence, chest pain, dysuria, fever, headaches, numbness, paresis, paresthesias, pelvic pain, perianal numbness, weakness or weight loss. He has tried heat for the symptoms. The treatment provided no relief.      Relevant past medical, surgical, family, and social history reviewed and updated as indicated.  Allergies and medications reviewed and updated. Data reviewed: Chart in Epic.   Past Medical History:  Diagnosis Date   Arthritis    r ankle   Asthma    Blood transfusion without reported diagnosis    Cataract    Personal history of colonic polyps - adenomas 09/13/2010   Sleep apnea     Past Surgical History:  Procedure Laterality  Date   ANKLE FRACTURE SURGERY     CARPAL TUNNEL RELEASE     right   COLONOSCOPY     vein repair r flank      Social History   Socioeconomic History   Marital status: Married    Spouse name: Not on file   Number of children: Not on file   Years of education: Not on file   Highest education level: Not on file  Occupational History   Occupation: POLICE OFFICER    Employer: [employer]  Tobacco Use   Smoking status: Never   Smokeless tobacco: Never  Vaping Use   Vaping Use: Never used  Substance and Sexual Activity   Alcohol use: Yes    Alcohol/week: 1.0 standard drink of alcohol    Types: 1 Cans of beer per week   Drug use: No   Sexual activity: Not on file  Other Topics Concern   Not on file  Social History Narrative   Not on file   Social Determinants of Health   Financial Resource Strain: Not on file  Food Insecurity: Not on file  Transportation Needs: Not on file  Physical Activity: Not on file  Stress: Not on file  Social Connections: Not on file  Intimate Partner Violence: Not on file    Outpatient Encounter Medications as of 05/02/2022  Medication Sig   vitamin C (ASCORBIC ACID) 500 MG tablet Take 500 mg by mouth daily.   Multiple Vitamins-Minerals (ZINC  PO) Take by mouth. (Patient not taking: Reported on 05/02/2022)   [DISCONTINUED] predniSONE (DELTASONE) 20 MG tablet 2 po at sametime daily for 5 days-   [EXPIRED] methylPREDNISolone acetate (DEPO-MEDROL) injection 80 mg    No facility-administered encounter medications on file as of 05/02/2022.    No Active Allergies  Review of Systems  Constitutional:  Negative for activity change, appetite change, chills, diaphoresis, fatigue, fever, unexpected weight change and weight loss.  HENT: Negative.  Negative for congestion.   Eyes: Negative.  Negative for photophobia and visual disturbance.  Respiratory:  Negative for cough, chest tightness and shortness of breath.   Cardiovascular:  Negative for  chest pain, palpitations and leg swelling.  Gastrointestinal:  Negative for abdominal pain, blood in stool, bowel incontinence, constipation, diarrhea, nausea and vomiting.  Endocrine: Negative.   Genitourinary:  Negative for bladder incontinence, decreased urine volume, difficulty urinating, dysuria, frequency, pelvic pain and urgency.  Musculoskeletal:  Positive for back pain. Negative for arthralgias, gait problem, joint swelling, myalgias, neck pain and neck stiffness.  Skin: Negative.   Allergic/Immunologic: Negative.   Neurological:  Positive for tingling. Negative for dizziness, tremors, seizures, syncope, facial asymmetry, speech difficulty, weakness, light-headedness, numbness, headaches and paresthesias.  Hematological: Negative.   Psychiatric/Behavioral:  Negative for agitation, confusion, hallucinations, sleep disturbance and suicidal ideas.   All other systems reviewed and are negative.       Objective:  BP 132/76   Pulse 60   Temp (!) 97.5 F (36.4 C) (Temporal)   Ht _0  (1.803 m)   Wt 243 lb (110.2 kg)   SpO2 98%   BMI 33.89 kg/m    Wt Readings from Last 3 Encounters:  05/02/22 243 lb (110.2 kg)  01/01/22 250 lb (113.4 kg)  08/18/21 241 lb 6 oz (109.5 kg)    Physical Exam Vitals and nursing note reviewed.  Constitutional:      General: He is not in acute distress.    Appearance: Normal appearance. He is well-developed and well-groomed. He is obese. He is not ill-appearing, toxic-appearing or diaphoretic.  HENT:     Head: Normocephalic and atraumatic.     Jaw: There is normal jaw occlusion.     Right Ear: Hearing normal.     Left Ear: Hearing normal.     Nose: Nose normal.     Mouth/Throat:     Lips: Pink.     Mouth: Mucous membranes are moist.     Pharynx: Oropharynx is clear. Uvula midline.  Eyes:     General: Lids are normal.     Extraocular Movements: Extraocular movements intact.     Conjunctiva/sclera: Conjunctivae normal.     Pupils: Pupils  are equal, round, and reactive to light.  Neck:     Thyroid: No thyroid mass, thyromegaly or thyroid tenderness.     Vascular: No carotid bruit or JVD.     Trachea: Trachea and phonation normal.  Cardiovascular:     Rate and Rhythm: Normal rate and regular rhythm.     Chest Wall: PMI is not displaced.     Pulses: Normal pulses.     Heart sounds: Normal heart sounds. No murmur heard.    No friction rub. No gallop.  Pulmonary:     Effort: Pulmonary effort is normal. No respiratory distress.     Breath sounds: Normal breath sounds. No wheezing.  Abdominal:     General: Bowel sounds are normal. There is no distension or abdominal bruit.     Palpations: Abdomen  is soft. There is no hepatomegaly or splenomegaly.     Tenderness: There is no abdominal tenderness. There is no right CVA tenderness or left CVA tenderness.     Hernia: No hernia is present.  Musculoskeletal:     Cervical back: Normal range of motion and neck supple.     Thoracic back: Normal.     Lumbar back: Tenderness present. No swelling, edema, deformity, signs of trauma, lacerations, spasms or bony tenderness. Normal range of motion. Positive left straight leg raise test. Negative right straight leg raise test. No scoliosis.     Right hip: Normal.     Left hip: Normal.     Right lower leg: No edema.     Left lower leg: No edema.  Lymphadenopathy:     Cervical: No cervical adenopathy.  Skin:    General: Skin is warm and dry.     Capillary Refill: Capillary refill takes less than 2 seconds.     Coloration: Skin is not cyanotic, jaundiced or pale.     Findings: No rash.  Neurological:     General: No focal deficit present.     Mental Status: He is alert and oriented to person, place, and time.     Sensory: Sensation is intact.     Motor: Motor function is intact.     Coordination: Coordination is intact.     Gait: Gait is intact.     Deep Tendon Reflexes: Reflexes are normal and symmetric.  Psychiatric:         Attention and Perception: Attention and perception normal.        Mood and Affect: Mood and affect normal.        Speech: Speech normal.        Behavior: Behavior normal. Behavior is cooperative.        Thought Content: Thought content normal.        Cognition and Memory: Cognition and memory normal.        Judgment: Judgment normal.     Results for orders placed or performed in visit on 08/18/21  CMP14+EGFR  Result Value Ref Range   Glucose 104 (H) 70 - 99 mg/dL   BUN 12 8 - 27 mg/dL   Creatinine, Ser 0.82 0.76 - 1.27 mg/dL   eGFR 99 >59 mL/min/1.73   BUN/Creatinine Ratio 15 10 - 24   Sodium 137 134 - 144 mmol/L   Potassium 4.2 3.5 - 5.2 mmol/L   Chloride 102 96 - 106 mmol/L   CO2 23 20 - 29 mmol/L   Calcium 9.9 8.6 - 10.2 mg/dL   Total Protein 7.4 6.0 - 8.5 g/dL   Albumin 4.3 3.8 - 4.8 g/dL   Globulin, Total 3.1 1.5 - 4.5 g/dL   Albumin/Globulin Ratio 1.4 1.2 - 2.2   Bilirubin Total 0.6 0.0 - 1.2 mg/dL   Alkaline Phosphatase 53 44 - 121 IU/L   AST 26 0 - 40 IU/L   ALT 39 0 - 44 IU/L  CBC with Differential/Platelet  Result Value Ref Range   WBC 5.8 3.4 - 10.8 x10E3/uL   RBC 4.84 4.14 - 5.80 x10E6/uL   Hemoglobin 14.5 13.0 - 17.7 g/dL   Hematocrit 42.0 37.5 - 51.0 %   MCV 87 79 - 97 fL   MCH 30.0 26.6 - 33.0 pg   MCHC 34.5 31.5 - 35.7 g/dL   RDW 12.9 11.6 - 15.4 %   Platelets 246 150 - 450 x10E3/uL   Neutrophils 54 Not Estab. %  Lymphs 30 Not Estab. %   Monocytes 10 Not Estab. %   Eos 4 Not Estab. %   Basos 1 Not Estab. %   Neutrophils Absolute 3.2 1.4 - 7.0 x10E3/uL   Lymphocytes Absolute 1.7 0.7 - 3.1 x10E3/uL   Monocytes Absolute 0.6 0.1 - 0.9 x10E3/uL   EOS (ABSOLUTE) 0.2 0.0 - 0.4 x10E3/uL   Basophils Absolute 0.1 0.0 - 0.2 x10E3/uL   Immature Granulocytes 1 Not Estab. %   Immature Grans (Abs) 0.0 0.0 - 0.1 x10E3/uL  HIV Antibody (routine testing w rflx)  Result Value Ref Range   HIV Screen 4th Generation wRfx Non Reactive Non Reactive  Hepatitis C  antibody  Result Value Ref Range   Hep C Virus Ab Non Reactive Non Reactive  Vitamin B12  Result Value Ref Range   Vitamin B-12 265 232 - 1,245 pg/mL  Lipid panel  Result Value Ref Range   Cholesterol, Total 146 100 - 199 mg/dL   Triglycerides 122 0 - 149 mg/dL   HDL 34 (L) >39 mg/dL   VLDL Cholesterol Cal 22 5 - 40 mg/dL   LDL Chol Calc (NIH) 90 0 - 99 mg/dL   Chol/HDL Ratio 4.3 0.0 - 5.0 ratio  Thyroid Panel With TSH  Result Value Ref Range   TSH 1.810 0.450 - 4.500 uIU/mL   T4, Total 6.5 4.5 - 12.0 ug/dL   T3 Uptake Ratio 28 24 - 39 %   Free Thyroxine Index 1.8 1.2 - 4.9  VITAMIN D 25 Hydroxy (Vit-D Deficiency, Fractures)  Result Value Ref Range   Vit D, 25-Hydroxy 50.6 30.0 - 100.0 ng/mL  Magnesium  Result Value Ref Range   Magnesium 2.0 1.6 - 2.3 mg/dL  PSA, total and free  Result Value Ref Range   Prostate Specific Ag, Serum 0.2 0.0 - 4.0 ng/mL   PSA, Free 0.04 N/A ng/mL   PSA, Free Pct 20.0 %       Pertinent labs & imaging results that were available during my care of the patient were reviewed by me and considered in my medical decision making.  Assessment & Plan:  Calvin Lin was seen today for back pain.  Diagnoses and all orders for this visit:  Acute left-sided low back pain with left-sided sciatica No red flags concerning for cauda equina syndrome. Will refer to PT and burst with steroids. Pt aware to report new, worsening, or persistent symptoms. -     Ambulatory referral to Physical Therapy -     methylPREDNISolone acetate (DEPO-MEDROL) injection 80 mg     Continue all other maintenance medications.  Follow up plan: Return in about 6 weeks (around 06/13/2022), or if symptoms worsen or fail to improve.   Continue healthy lifestyle choices, including diet (rich in fruits, vegetables, and lean proteins, and low in salt and simple carbohydrates) and exercise (at least 30 minutes of moderate physical activity daily).  Educational handout given for  sciatica  The above assessment and management plan was discussed with the patient. The patient verbalized understanding of and has agreed to the management plan. Patient is aware to call the clinic if they develop any new symptoms or if symptoms persist or worsen. Patient is aware when to return to the clinic for a follow-up visit. Patient educated on when it is appropriate to go to the emergency department.   Monia Pouch, FNP-C [facility] [phone]

## 2022-05-07 ENCOUNTER — Telehealth: Payer: Self-pay | Admitting: Family Medicine

## 2022-05-07 ENCOUNTER — Ambulatory Visit: Payer: No Typology Code available for payment source | Admitting: Physical Therapy

## 2022-06-13 ENCOUNTER — Encounter: Payer: Self-pay | Admitting: Family Medicine

## 2022-06-13 ENCOUNTER — Ambulatory Visit (INDEPENDENT_AMBULATORY_CARE_PROVIDER_SITE_OTHER): Payer: No Typology Code available for payment source | Admitting: Family Medicine

## 2022-06-13 VITALS — BP 121/67 | HR 62 | Temp 97.8°F | Ht 71.0 in | Wt 246.0 lb

## 2022-06-13 DIAGNOSIS — M5442 Lumbago with sciatica, left side: Secondary | ICD-10-CM | POA: Diagnosis not present

## 2022-06-13 NOTE — Progress Notes (Signed)
Subjective:  Patient ID: Calvin Lin, male    DOB: 11-17-1960, 61 y.o.   MRN: [id number]  Patient Care Team: Baruch Gouty, FNP as PCP - General (Family Medicine)   Chief Complaint:  Sciatica (6 week follow up- Patient states it is much better. )   HPI: Calvin Lin is a 61 y.o. male presenting on 06/13/2022 for Sciatica (6 week follow up- Patient states it is much better. )    1. Acute left-sided low back pain with left-sided sciatica Has improved greatly with home exercises, PT, and chiropractor manipulation. Denies loss of bowel or bladder, saddle anesthesia, or weakness.     Relevant past medical, surgical, family, and social history reviewed and updated as indicated.  Allergies and medications reviewed and updated. Data reviewed: Chart in Epic.   Past Medical History:  Diagnosis Date   Arthritis    r ankle   Asthma    Blood transfusion without reported diagnosis    Cataract    Personal history of colonic polyps - adenomas 09/13/2010   Sleep apnea     Past Surgical History:  Procedure Laterality Date   ANKLE FRACTURE SURGERY     CARPAL TUNNEL RELEASE     right   COLONOSCOPY     vein repair r flank      Social History   Socioeconomic History   Marital status: Married    Spouse name: Not on file   Number of children: Not on file   Years of education: Not on file   Highest education level: Not on file  Occupational History   Occupation: POLICE OFFICER    Employer: [employer]  Tobacco Use   Smoking status: Never   Smokeless tobacco: Never  Vaping Use   Vaping Use: Never used  Substance and Sexual Activity   Alcohol use: Yes    Alcohol/week: 1.0 standard drink of alcohol    Types: 1 Cans of beer per week   Drug use: No   Sexual activity: Not on file  Other Topics Concern   Not on file  Social History Narrative   Not on file   Social Determinants of Health   Financial Resource Strain: Not on file  Food Insecurity: Not on file   Transportation Needs: Not on file  Physical Activity: Not on file  Stress: Not on file  Social Connections: Not on file  Intimate Partner Violence: Not on file    Outpatient Encounter Medications as of 06/13/2022  Medication Sig   Multiple Vitamins-Minerals (ZINC PO) Take by mouth.   vitamin C (ASCORBIC ACID) 500 MG tablet Take 500 mg by mouth daily.   No facility-administered encounter medications on file as of 06/13/2022.    No Active Allergies  Review of Systems  Constitutional:  Negative for activity change, appetite change, chills, diaphoresis, fatigue, fever and unexpected weight change.  HENT: Negative.    Eyes: Negative.  Negative for photophobia and visual disturbance.  Respiratory:  Negative for cough, chest tightness and shortness of breath.   Cardiovascular:  Negative for chest pain, palpitations and leg swelling.  Gastrointestinal:  Negative for abdominal pain, blood in stool, constipation, diarrhea, nausea and vomiting.  Endocrine: Negative.   Genitourinary:  Negative for decreased urine volume, difficulty urinating, dysuria, frequency and urgency.  Musculoskeletal:  Positive for arthralgias, back pain and myalgias. Negative for gait problem, joint swelling, neck pain and neck stiffness.  Skin: Negative.   Allergic/Immunologic: Negative.   Neurological:  Negative for dizziness,  tremors, seizures, syncope, facial asymmetry, speech difficulty, weakness, light-headedness, numbness and headaches.  Hematological: Negative.   Psychiatric/Behavioral:  Negative for confusion, hallucinations, sleep disturbance and suicidal ideas.   All other systems reviewed and are negative.       Objective:  BP 121/67   Pulse 62   Temp 97.8 F (36.6 C) (Temporal)   Ht _0  (1.803 m)   Wt 246 lb (111.6 kg)   SpO2 97%   BMI 34.31 kg/m    Wt Readings from Last 3 Encounters:  06/13/22 246 lb (111.6 kg)  05/02/22 243 lb (110.2 kg)  01/01/22 250 lb (113.4 kg)    Physical  Exam Vitals and nursing note reviewed.  Constitutional:      General: He is not in acute distress.    Appearance: Normal appearance. He is well-developed and well-groomed. He is obese. He is not ill-appearing, toxic-appearing or diaphoretic.  HENT:     Head: Normocephalic and atraumatic.     Jaw: There is normal jaw occlusion.     Right Ear: Hearing normal.     Left Ear: Hearing normal.     Nose: Nose normal.     Mouth/Throat:     Lips: Pink.     Mouth: Mucous membranes are moist.     Pharynx: Uvula midline.  Eyes:     General: Lids are normal.     Pupils: Pupils are equal, round, and reactive to light.  Neck:     Thyroid: No thyroid mass, thyromegaly or thyroid tenderness.     Vascular: No carotid bruit or JVD.     Trachea: Trachea and phonation normal.  Cardiovascular:     Rate and Rhythm: Normal rate and regular rhythm.     Chest Wall: PMI is not displaced.     Pulses: Normal pulses.     Heart sounds: Normal heart sounds. No murmur heard.    No friction rub. No gallop.  Pulmonary:     Effort: Pulmonary effort is normal. No respiratory distress.     Breath sounds: Normal breath sounds. No wheezing.  Abdominal:     General: There is no abdominal bruit.     Palpations: There is no hepatomegaly or splenomegaly.  Musculoskeletal:     Cervical back: Normal range of motion and neck supple.     Thoracic back: Normal.     Lumbar back: Tenderness (left SI joint) present. No swelling, edema, deformity, signs of trauma, lacerations, spasms or bony tenderness. Normal range of motion. Positive left straight leg raise test. Negative right straight leg raise test. No scoliosis.     Right hip: Normal.     Left hip: Normal.     Right lower leg: No edema.     Left lower leg: No edema.  Lymphadenopathy:     Cervical: No cervical adenopathy.  Skin:    General: Skin is warm and dry.     Capillary Refill: Capillary refill takes less than 2 seconds.     Coloration: Skin is not cyanotic,  jaundiced or pale.     Findings: No rash.  Neurological:     General: No focal deficit present.     Mental Status: He is alert and oriented to person, place, and time.     Sensory: Sensation is intact.     Motor: Motor function is intact.     Coordination: Coordination is intact.     Gait: Gait is intact.     Deep Tendon Reflexes: Reflexes are normal and  symmetric.  Psychiatric:        Attention and Perception: Attention and perception normal.        Mood and Affect: Mood and affect normal.        Speech: Speech normal.        Behavior: Behavior normal. Behavior is cooperative.        Thought Content: Thought content normal.        Cognition and Memory: Cognition and memory normal.        Judgment: Judgment normal.     Results for orders placed or performed in visit on 08/18/21  CMP14+EGFR  Result Value Ref Range   Glucose 104 (H) 70 - 99 mg/dL   BUN 12 8 - 27 mg/dL   Creatinine, Ser 0.82 0.76 - 1.27 mg/dL   eGFR 99 >59 mL/min/1.73   BUN/Creatinine Ratio 15 10 - 24   Sodium 137 134 - 144 mmol/L   Potassium 4.2 3.5 - 5.2 mmol/L   Chloride 102 96 - 106 mmol/L   CO2 23 20 - 29 mmol/L   Calcium 9.9 8.6 - 10.2 mg/dL   Total Protein 7.4 6.0 - 8.5 g/dL   Albumin 4.3 3.8 - 4.8 g/dL   Globulin, Total 3.1 1.5 - 4.5 g/dL   Albumin/Globulin Ratio 1.4 1.2 - 2.2   Bilirubin Total 0.6 0.0 - 1.2 mg/dL   Alkaline Phosphatase 53 44 - 121 IU/L   AST 26 0 - 40 IU/L   ALT 39 0 - 44 IU/L  CBC with Differential/Platelet  Result Value Ref Range   WBC 5.8 3.4 - 10.8 x10E3/uL   RBC 4.84 4.14 - 5.80 x10E6/uL   Hemoglobin 14.5 13.0 - 17.7 g/dL   Hematocrit 42.0 37.5 - 51.0 %   MCV 87 79 - 97 fL   MCH 30.0 26.6 - 33.0 pg   MCHC 34.5 31.5 - 35.7 g/dL   RDW 12.9 11.6 - 15.4 %   Platelets 246 150 - 450 x10E3/uL   Neutrophils 54 Not Estab. %   Lymphs 30 Not Estab. %   Monocytes 10 Not Estab. %   Eos 4 Not Estab. %   Basos 1 Not Estab. %   Neutrophils Absolute 3.2 1.4 - 7.0 x10E3/uL    Lymphocytes Absolute 1.7 0.7 - 3.1 x10E3/uL   Monocytes Absolute 0.6 0.1 - 0.9 x10E3/uL   EOS (ABSOLUTE) 0.2 0.0 - 0.4 x10E3/uL   Basophils Absolute 0.1 0.0 - 0.2 x10E3/uL   Immature Granulocytes 1 Not Estab. %   Immature Grans (Abs) 0.0 0.0 - 0.1 x10E3/uL  HIV Antibody (routine testing w rflx)  Result Value Ref Range   HIV Screen 4th Generation wRfx Non Reactive Non Reactive  Hepatitis C antibody  Result Value Ref Range   Hep C Virus Ab Non Reactive Non Reactive  Vitamin B12  Result Value Ref Range   Vitamin B-12 265 232 - 1,245 pg/mL  Lipid panel  Result Value Ref Range   Cholesterol, Total 146 100 - 199 mg/dL   Triglycerides 122 0 - 149 mg/dL   HDL 34 (L) >39 mg/dL   VLDL Cholesterol Cal 22 5 - 40 mg/dL   LDL Chol Calc (NIH) 90 0 - 99 mg/dL   Chol/HDL Ratio 4.3 0.0 - 5.0 ratio  Thyroid Panel With TSH  Result Value Ref Range   TSH 1.810 0.450 - 4.500 uIU/mL   T4, Total 6.5 4.5 - 12.0 ug/dL   T3 Uptake Ratio 28 24 - 39 %   Free  Thyroxine Index 1.8 1.2 - 4.9  VITAMIN D 25 Hydroxy (Vit-D Deficiency, Fractures)  Result Value Ref Range   Vit D, 25-Hydroxy 50.6 30.0 - 100.0 ng/mL  Magnesium  Result Value Ref Range   Magnesium 2.0 1.6 - 2.3 mg/dL  PSA, total and free  Result Value Ref Range   Prostate Specific Ag, Serum 0.2 0.0 - 4.0 ng/mL   PSA, Free 0.04 N/A ng/mL   PSA, Free Pct 20.0 %       Pertinent labs & imaging results that were available during my care of the patient were reviewed by me and considered in my medical decision making.  Assessment & Plan:  Calvin Lin was seen today for sciatica.  Diagnoses and all orders for this visit:  Acute left-sided low back pain with left-sided sciatica Has been doing PT and going to chiropractor. This has been very beneficial. No red flags concerning for cauda equina syndrome present. Report new, worsening, or persistent symptoms.     Continue all other maintenance medications.  Follow up plan: Return in about 3 months  (around 09/12/2022) for CPE.   Continue healthy lifestyle choices, including diet (rich in fruits, vegetables, and lean proteins, and low in salt and simple carbohydrates) and exercise (at least 30 minutes of moderate physical activity daily).  Educational handout given for sciatica   The above assessment and management plan was discussed with the patient. The patient verbalized understanding of and has agreed to the management plan. Patient is aware to call the clinic if they develop any new symptoms or if symptoms persist or worsen. Patient is aware when to return to the clinic for a follow-up visit. Patient educated on when it is appropriate to go to the emergency department.   Monia Pouch, FNP-C [facility] [phone]

## 2022-09-04 ENCOUNTER — Encounter: Payer: Self-pay | Admitting: Family

## 2022-09-04 ENCOUNTER — Telehealth (INDEPENDENT_AMBULATORY_CARE_PROVIDER_SITE_OTHER): Payer: No Typology Code available for payment source | Admitting: Family

## 2022-09-04 DIAGNOSIS — J4521 Mild intermittent asthma with (acute) exacerbation: Secondary | ICD-10-CM | POA: Diagnosis not present

## 2022-09-04 MED ORDER — PREDNISONE 10 MG (21) PO TBPK: ORAL_TABLET | ORAL | 0 refills | Status: DC

## 2022-09-04 MED ORDER — CETIRIZINE HCL 10 MG PO TABS: 10.0000 mg | ORAL_TABLET | Freq: Every day | ORAL | 1 refills | Status: AC

## 2022-09-04 MED ORDER — FLUTICASONE PROPIONATE 50 MCG/ACT NA SUSP
2.0000 | Freq: Every day | NASAL | 6 refills | Status: AC
Start: 2022-09-04 — End: ?

## 2022-09-04 NOTE — Patient Instructions (Signed)

## 2022-09-04 NOTE — Progress Notes (Signed)
Virtual Visit Consent   Calvin Lin, you are scheduled for a virtual visit with a [facility] provider today. Just as with appointments in the office, your consent must be obtained to participate. Your consent will be active for this visit and any virtual visit you may have with one of our providers in the next 365 days. If you have a MyChart account, a copy of this consent can be sent to you electronically.  As this is a virtual visit, video technology does not allow for your provider to perform a traditional examination. This may limit your provider's ability to fully assess your condition. If your provider identifies any concerns that need to be evaluated in person or the need to arrange testing (such as labs, EKG, etc.), we will make arrangements to do so. Although advances in technology are sophisticated, we cannot ensure that it will always work on either your end or our end. If the connection with a video visit is poor, the visit may have to be switched to a telephone visit. With either a video or telephone visit, we are not always able to ensure that we have a secure connection.  By engaging in this virtual visit, you consent to the provision of healthcare and authorize for your insurance to be billed (if applicable) for the services provided during this visit. Depending on your insurance coverage, you may receive a charge related to this service.  I need to obtain your verbal consent now. Are you willing to proceed with your visit today? Calvin Lin has provided verbal consent on 09/04/2022 for a virtual visit (video or telephone). Evelina Dun, FNP  Date: 09/04/2022 8:49 AM  Virtual Visit via Video Note   I, Evelina Dun, connected with  Calvin Lin  ([id number], 1964/07/15) on 09/04/22 at  9:35 AM EDT by a video-enabled telemedicine application and verified that I am speaking with the correct person using two identifiers.  Location: Patient: Virtual Visit Location Patient:  Home Provider: Virtual Visit Location Provider: Office/Clinic   I discussed the limitations of evaluation and management by telemedicine and the availability of in person appointments. The patient expressed understanding and agreed to proceed.    History of Present Illness: Calvin Lin is a 62 y.o. who identifies as a male who was assigned male at birth, and is being seen today for congestion, sore throat, coughing that started three days ago.  HPI: Cough This is a new problem. The current episode started in the past 7 days. The problem has been waxing and waning. The problem occurs every few minutes. The cough is Productive of sputum. Associated symptoms include chills, ear congestion, nasal congestion, postnasal drip and a sore throat. Pertinent negatives include no ear pain, fever, headaches, myalgias, shortness of breath or wheezing. He has tried rest and OTC cough suppressant for the symptoms. The treatment provided mild relief. His past medical history is significant for asthma.    Problems:  Patient Active Problem List   Diagnosis Date Noted   Arthritis of right ankle 09/09/2018   Polyarthralgia 09/09/2018   Personal history of colonic polyps - adenomas 09/13/2010   ANXIETY 03/24/2007   ASTHMA 03/24/2007   OSTEOARTHRITIS 03/24/2007   Asthma 03/24/2007    Allergies: No Active Allergies Medications:  Current Outpatient Medications:    cetirizine (ZYRTEC ALLERGY) 10 MG tablet, Take 1 tablet (10 mg total) by mouth daily., Disp: 90 tablet, Rfl: 1   fluticasone (FLONASE) 50 MCG/ACT nasal spray, Place 2 sprays into both nostrils daily., Disp:  16 g, Rfl: 6   predniSONE (STERAPRED UNI-PAK 21 TAB) 10 MG (21) TBPK tablet, Use as directed, Disp: 21 tablet, Rfl: 0   Multiple Vitamins-Minerals (ZINC PO), Take by mouth., Disp: , Rfl:    vitamin C (ASCORBIC ACID) 500 MG tablet, Take 500 mg by mouth daily., Disp: , Rfl:   Observations/Objective: Patient is well-developed, well-nourished in  no acute distress.  Resting comfortably  at home.  Head is normocephalic, atraumatic.  No labored breathing.  Speech is clear and coherent with logical content.  Patient is alert and oriented at baseline.  Hoarse voice  Assessment and Plan: 1. Mild intermittent asthma with acute exacerbation - predniSONE (STERAPRED UNI-PAK 21 TAB) 10 MG (21) TBPK tablet; Use as directed  Dispense: 21 tablet; Refill: 0 - cetirizine (ZYRTEC ALLERGY) 10 MG tablet; Take 1 tablet (10 mg total) by mouth daily.  Dispense: 90 tablet; Refill: 1 - fluticasone (FLONASE) 50 MCG/ACT nasal spray; Place 2 sprays into both nostrils daily.  Dispense: 16 g; Refill: 6  - Take meds as prescribed - Use a cool mist humidifier  -Use saline nose sprays frequently -Force fluids -For any cough or congestion  Use plain Mucinex- regular strength or max strength is fine -For fever or aces or pains- take tylenol or ibuprofen. -Throat lozenges if help -Work note given Follow up if symptoms worsen or do not improve   Follow Up Instructions: I discussed the assessment and treatment plan with the patient. The patient was provided an opportunity to ask questions and all were answered. The patient agreed with the plan and demonstrated an understanding of the instructions.  A copy of instructions were sent to the patient via MyChart unless otherwise noted below.     The patient was advised to call back or seek an in-person evaluation if the symptoms worsen or if the condition fails to improve as anticipated.  Time:  I spent 6 minutes with the patient via telehealth technology discussing the above problems/concerns.    Evelina Dun, FNP

## 2022-09-12 ENCOUNTER — Ambulatory Visit (INDEPENDENT_AMBULATORY_CARE_PROVIDER_SITE_OTHER): Payer: No Typology Code available for payment source | Admitting: Family Medicine

## 2022-09-12 ENCOUNTER — Encounter: Payer: Self-pay | Admitting: Family Medicine

## 2022-09-12 VITALS — BP 113/71 | HR 59 | Temp 97.3°F | Ht 71.0 in | Wt 240.2 lb

## 2022-09-12 DIAGNOSIS — E559 Vitamin D deficiency, unspecified: Secondary | ICD-10-CM | POA: Diagnosis not present

## 2022-09-12 DIAGNOSIS — Z130 Encounter for screening for diseases of the blood and blood-forming organs and certain disorders involving the immune mechanism: Secondary | ICD-10-CM

## 2022-09-12 DIAGNOSIS — Z125 Encounter for screening for malignant neoplasm of prostate: Secondary | ICD-10-CM

## 2022-09-12 DIAGNOSIS — Z1329 Encounter for screening for other suspected endocrine disorder: Secondary | ICD-10-CM

## 2022-09-12 DIAGNOSIS — Z0001 Encounter for general adult medical examination with abnormal findings: Secondary | ICD-10-CM

## 2022-09-12 DIAGNOSIS — Z13220 Encounter for screening for lipoid disorders: Secondary | ICD-10-CM

## 2022-09-12 DIAGNOSIS — Z0000 Encounter for general adult medical examination without abnormal findings: Secondary | ICD-10-CM

## 2022-09-12 LAB — LIPID PANEL

## 2022-09-12 NOTE — Progress Notes (Signed)
Calvin Lin is a 62 y.o. male presents to office today for annual physical exam examination.    Concerns today include: 1. None  Occupation: [employer] Marital status: married Substance use: none Diet: generally healthy Exercise: daily Last eye exam: 2023 Last dental exam: regular Last colonoscopy: 2020 Immunizations needed:  Flu Vaccine: denies  Tdap Vaccine: no  Zoster Vaccine: declined Pneumonia Vaccine: no  Past Medical History:  Diagnosis Date   Arthritis    r ankle   Asthma    Blood transfusion without reported diagnosis    Cataract    Personal history of colonic polyps - adenomas 09/13/2010   Sleep apnea    Social History   Socioeconomic History   Marital status: Married    Spouse name: Not on file   Number of children: Not on file   Years of education: Not on file   Highest education level: Not on file  Occupational History   Occupation: POLICE OFFICER    Employer: [employer]  Tobacco Use   Smoking status: Never   Smokeless tobacco: Never  Vaping Use   Vaping Use: Never used  Substance and Sexual Activity   Alcohol use: Yes    Alcohol/week: 1.0 standard drink of alcohol    Types: 1 Cans of beer per week   Drug use: No   Sexual activity: Not on file  Other Topics Concern   Not on file  Social History Narrative   Not on file   Social Determinants of Health   Financial Resource Strain: Not on file  Food Insecurity: Not on file  Transportation Needs: Not on file  Physical Activity: Not on file  Stress: Not on file  Social Connections: Not on file  Intimate Partner Violence: Not on file   Past Surgical History:  Procedure Laterality Date   ANKLE FRACTURE SURGERY     CARPAL TUNNEL RELEASE     right   COLONOSCOPY     vein repair r flank     Family History  Problem Relation Age of Onset   Esophageal cancer Father    Stomach cancer Father    Colon cancer Maternal Uncle    Rectal cancer Neg Hx     Current  Outpatient Medications:    cetirizine (ZYRTEC ALLERGY) 10 MG tablet, Take 1 tablet (10 mg total) by mouth daily., Disp: 90 tablet, Rfl: 1   fluticasone (FLONASE) 50 MCG/ACT nasal spray, Place 2 sprays into both nostrils daily., Disp: 16 g, Rfl: 6   Multiple Vitamins-Minerals (ZINC PO), Take by mouth., Disp: , Rfl:    vitamin C (ASCORBIC ACID) 500 MG tablet, Take 500 mg by mouth daily., Disp: , Rfl:   No Active Allergies   Review of Systems Constitutional: positive for weight gain Eyes: negative Ears, nose, mouth, throat, and face: negative Respiratory: negative Cardiovascular: negative Gastrointestinal: negative Genitourinary:negative Integument/breast: negative Hematologic/lymphatic: negative Musculoskeletal:negative Neurological: negative Behavioral/Psych: negative Endocrine: negative Allergic/Immunologic: negative    Physical exam BP 113/71   Pulse (!) 59   Temp (!) 97.3 F (36.3 C) (Temporal)   Ht 5\' 11"  (1.803 m)   Wt 240 lb 3.2 oz (109 kg)   SpO2 95%   BMI 33.50 kg/m  General appearance: alert, cooperative, appears stated age, no distress, and mildly obese Head: Normocephalic, without obvious abnormality, atraumatic Eyes: conjunctivae/corneas clear. PERRL, EOM's intact. Fundi benign. Ears: normal TM's and external ear canals both ears Nose: Nares normal. Septum midline. Mucosa normal. No drainage or sinus  tenderness. Throat: normal findings: lips normal without lesions, buccal mucosa normal, gums healthy, palate normal, tongue midline and normal, soft palate, uvula, and tonsils normal, and oropharynx pink & moist without lesions or evidence of thrush and abnormal findings: dentition: caries present Neck: no adenopathy, no carotid bruit, no JVD, supple, symmetrical, trachea midline, and thyroid not enlarged, symmetric, no tenderness/mass/nodules Back: negative, symmetric, no curvature. ROM normal. No CVA tenderness. Lungs: clear to auscultation bilaterally and normal  percussion bilaterally Chest wall: no tenderness Heart: regular rate and rhythm, S1, S2 normal, no murmur, click, rub or gallop Abdomen: soft, non-tender; bowel sounds normal; no masses,  no organomegaly Extremities: extremities normal, atraumatic, no cyanosis or edema Pulses: 2+ and symmetric Skin: Skin color, texture, turgor normal. No rashes or lesions Lymph nodes: Cervical, supraclavicular, and axillary nodes normal. Neurologic: Alert and oriented X 3, normal strength and tone. Normal symmetric reflexes. Normal coordination and gait    Assessment/ Plan: [name] was seen today for annual exam.  Diagnoses and all orders for this visit:  Annual physical exam -     CBC with Differential/Platelet -     CMP14+EGFR -     Lipid panel -     Thyroid Panel With TSH -     PSA, total and free  Screening for prostate cancer -     PSA, total and free  Screening for lipid disorders -     Lipid panel  Screening for endocrine disorder -     CMP14+EGFR -     Thyroid Panel With TSH  Screening for deficiency anemia -     CBC with Differential/Platelet  Vitamin D deficiency -     VITAMIN D 25 Hydroxy (Vit-D Deficiency, Fractures)      Counseled on healthy lifestyle choices, including diet (rich in fruits, vegetables and lean meats and low in salt and simple carbohydrates) and exercise (at least 30 minutes of moderate physical activity daily).  Patient to follow up in 1 year for annual exam or sooner if needed.  The above assessment and management plan was discussed with the patient. The patient verbalized understanding of and has agreed to the management plan. Patient is aware to call the clinic if symptoms persist or worsen. Patient is aware when to return to the clinic for a follow-up visit. Patient educated on when it is appropriate to go to the emergency department.   Monia Pouch, FNP-C [facility] [address] [place], [place] [address] [phone]

## 2022-09-13 LAB — CBC WITH DIFFERENTIAL/PLATELET
Basophils Absolute: 0.1 10*3/uL (ref 0.0–0.2)
Basos: 1 %
EOS (ABSOLUTE): 0.3 10*3/uL (ref 0.0–0.4)
Eos: 5 %
Hematocrit: 42 % (ref 37.5–51.0)
Hemoglobin: 14.8 g/dL (ref 13.0–17.7)
Immature Grans (Abs): 0 10*3/uL (ref 0.0–0.1)
Immature Granulocytes: 1 %
Lymphocytes Absolute: 1.7 10*3/uL (ref 0.7–3.1)
Lymphs: 27 %
MCH: 32 pg (ref 26.6–33.0)
MCHC: 35.2 g/dL (ref 31.5–35.7)
MCV: 91 fL (ref 79–97)
Monocytes Absolute: 0.6 10*3/uL (ref 0.1–0.9)
Monocytes: 9 %
Neutrophils Absolute: 3.6 10*3/uL (ref 1.4–7.0)
Neutrophils: 57 %
Platelets: 267 10*3/uL (ref 150–450)
RBC: 4.62 x10E6/uL (ref 4.14–5.80)
RDW: 12.7 % (ref 11.6–15.4)
WBC: 6.3 10*3/uL (ref 3.4–10.8)

## 2022-09-13 LAB — CMP14+EGFR
ALT: 27 IU/L (ref 0–44)
AST: 21 IU/L (ref 0–40)
Albumin/Globulin Ratio: 1.4 (ref 1.2–2.2)
Albumin: 4.2 g/dL (ref 3.9–4.9)
Alkaline Phosphatase: 49 IU/L (ref 44–121)
BUN/Creatinine Ratio: 18 (ref 10–24)
BUN: 15 mg/dL (ref 8–27)
Bilirubin Total: 0.5 mg/dL (ref 0.0–1.2)
CO2: 22 mmol/L (ref 20–29)
Calcium: 9.6 mg/dL (ref 8.6–10.2)
Chloride: 101 mmol/L (ref 96–106)
Creatinine, Ser: 0.83 mg/dL (ref 0.76–1.27)
Globulin, Total: 2.9 g/dL (ref 1.5–4.5)
Glucose: 106 mg/dL — ABNORMAL HIGH (ref 70–99)
Potassium: 4.6 mmol/L (ref 3.5–5.2)
Sodium: 138 mmol/L (ref 134–144)
Total Protein: 7.1 g/dL (ref 6.0–8.5)
eGFR: 98 mL/min/{1.73_m2} (ref >59)

## 2022-09-13 LAB — LIPID PANEL
Chol/HDL Ratio: 3.6 ratio (ref 0.0–5.0)
Cholesterol, Total: 135 mg/dL (ref 100–199)
HDL: 37 mg/dL — ABNORMAL LOW (ref >39)
LDL Chol Calc (NIH): 82 mg/dL (ref 0–99)
Triglycerides: 83 mg/dL (ref 0–149)
VLDL Cholesterol Cal: 16 mg/dL (ref 5–40)

## 2022-09-13 LAB — PSA, TOTAL AND FREE
PSA, Free Pct: 50 %
PSA, Free: 0.05 ng/mL
Prostate Specific Ag, Serum: 0.1 ng/mL (ref 0.0–4.0)

## 2022-09-13 LAB — THYROID PANEL WITH TSH
Free Thyroxine Index: 2 (ref 1.2–4.9)
T3 Uptake Ratio: 29 % (ref 24–39)
T4, Total: 6.9 ug/dL (ref 4.5–12.0)
TSH: 0.943 u[IU]/mL (ref 0.450–4.500)

## 2022-09-13 LAB — VITAMIN D 25 HYDROXY (VIT D DEFICIENCY, FRACTURES): Vit D, 25-Hydroxy: 39.3 ng/mL (ref 30.0–100.0)

## 2022-11-19 ENCOUNTER — Telehealth: Payer: Self-pay | Admitting: Family Medicine

## 2022-11-20 NOTE — Telephone Encounter (Signed)
Pt aware of provider feedback and voiced understanding. 

## 2023-01-15 ENCOUNTER — Ambulatory Visit: Payer: No Typology Code available for payment source | Admitting: Family

## 2023-12-26 ENCOUNTER — Other Ambulatory Visit: Payer: Self-pay

## 2023-12-26 ENCOUNTER — Encounter (HOSPITAL_COMMUNITY): Payer: Self-pay | Admitting: Emergency Medicine

## 2023-12-26 ENCOUNTER — Emergency Department (HOSPITAL_COMMUNITY)
Admission: EM | Admit: 2023-12-26 | Discharge: 2023-12-27 | Disposition: A | Discharge status: Home / Self Care | Attending: Emergency Medicine | Admitting: Emergency Medicine

## 2023-12-26 DIAGNOSIS — S4991XA Unspecified injury of right shoulder and upper arm, initial encounter: Secondary | ICD-10-CM | POA: Diagnosis present

## 2023-12-26 DIAGNOSIS — W01198A Fall on same level from slipping, tripping and stumbling with subsequent striking against other object, initial encounter: Secondary | ICD-10-CM | POA: Diagnosis not present

## 2023-12-26 DIAGNOSIS — J45909 Unspecified asthma, uncomplicated: Secondary | ICD-10-CM | POA: Insufficient documentation

## 2023-12-26 DIAGNOSIS — S43034A Inferior dislocation of right humerus, initial encounter: Secondary | ICD-10-CM | POA: Insufficient documentation

## 2023-12-26 NOTE — ED Triage Notes (Signed)
 Patient BIB EMS for evaluation of R shoulder injury and pain.  Reports he was getting out of the shower and tripped over his dog.  Clemens forward and hit wall.   No reports of LOC.  Did not hit his head.  Unable to move R arm from current position.  Given Morphine  8 mg IV PTA

## 2023-12-27 ENCOUNTER — Emergency Department (HOSPITAL_COMMUNITY)

## 2023-12-27 DIAGNOSIS — S43034A Inferior dislocation of right humerus, initial encounter: Secondary | ICD-10-CM | POA: Diagnosis not present

## 2023-12-27 MED ORDER — PROPOFOL 10 MG/ML IV BOLUS
1.0000 mg/kg | Freq: Once | INTRAVENOUS | Status: DC
  Filled 2023-12-27: qty 20

## 2023-12-27 MED ORDER — HYDROMORPHONE HCL 1 MG/ML IJ SOLN
1.0000 mg | Freq: Once | INTRAMUSCULAR | Status: AC
  Administered 2023-12-27: 1 mg via INTRAVENOUS
  Filled 2023-12-27: qty 1

## 2023-12-27 MED ORDER — PROPOFOL 10 MG/ML IV BOLUS
INTRAVENOUS | Status: AC | PRN
  Administered 2023-12-27 (×2): 50 mg via INTRAVENOUS

## 2023-12-27 MED ORDER — MORPHINE SULFATE (PF) 4 MG/ML IV SOLN
4.0000 mg | Freq: Once | INTRAVENOUS | Status: AC
  Administered 2023-12-27: 4 mg via INTRAVENOUS
  Filled 2023-12-27: qty 1

## 2023-12-27 MED ORDER — OXYCODONE HCL 5 MG PO TABS: 5.0000 mg | ORAL_TABLET | ORAL | 0 refills | Status: AC | PRN

## 2023-12-27 NOTE — ED Notes (Signed)
 Patient remains drowsy after last pain medication administration.  Patient repositioned in bed and placed in sitting position.  Pending discharge once patient more alert and able to ambulate

## 2023-12-27 NOTE — Progress Notes (Signed)
 RT present for conscious sedation for shoulder dislocation. ETCO2 monitoring applied with 4 lpm nasal cannula. VSS throughout procedure and patient awake upon RT leaving room.

## 2023-12-27 NOTE — Sedation Documentation (Signed)
 Vital signs stable.

## 2023-12-27 NOTE — ED Provider Notes (Signed)
 AP-EMERGENCY DEPT [facility] Emergency Department Provider Note MRN:  [id number]  Arrival date & time: 12/27/23     Chief Complaint   Shoulder Pain   History of Present Illness   Calvin Lin is a 63 y.o. year-old male with no pertinent past medical history presenting to the ED with chief complaint of pain.  Tripped while getting out of the shower, fell forward against the wall.  Denies head trauma, no loss of consciousness, no neck or back pain, no chest pain or abdominal pain.  Isolated right shoulder discomfort, very severe, shoulder is stuck in the upward position.  Review of Systems  A thorough review of systems was obtained and all systems are negative except as noted in the HPI and PMH.   Patient's Health History    Past Medical History:  Diagnosis Date   Arthritis    r ankle   Asthma    Blood transfusion without reported diagnosis    Cataract    Personal history of colonic polyps - adenomas 09/13/2010   Sleep apnea     Past Surgical History:  Procedure Laterality Date   ANKLE FRACTURE SURGERY     CARPAL TUNNEL RELEASE     right   COLONOSCOPY     vein repair r flank      Family History  Problem Relation Age of Onset   Esophageal cancer Father    Stomach cancer Father    Colon cancer Maternal Uncle    Rectal cancer Neg Hx     Social History   Socioeconomic History   Marital status: Married    Spouse name: Not on file   Number of children: Not on file   Years of education: Not on file   Highest education level: Not on file  Occupational History   Occupation: POLICE OFFICER    Employer: [employer]  Tobacco Use   Smoking status: Never   Smokeless tobacco: Never  Vaping Use   Vaping status: Never Used  Substance and Sexual Activity   Alcohol use: Yes    Alcohol/week: 1.0 standard drink of alcohol    Types: 1 Cans of beer per week   Drug use: No   Sexual activity: Not on file  Other Topics Concern   Not on file  Social History  Narrative   Not on file   Social Drivers of Health   Financial Resource Strain: Not on file  Food Insecurity: Low Risk  (09/13/2023)   Received from [facility]   Hunger Vital Sign    Within the past 12 months, you worried that your food would run out before you got money to buy more: Never true    Within the past 12 months, the food you bought just didn't last and you didn't have money to get more. : Never true  Transportation Needs: No Transportation Needs (09/13/2023)   Received from [facility]    In the past 12 months, has lack of reliable transportation kept you from medical appointments, meetings, work or from getting things needed for daily living? : No  Physical Activity: Not on file  Stress: Not on file  Social Connections: Unknown (10/30/2021)   Received from [facility]   Social Network    Social Network: Not on file  Intimate Partner Violence: Unknown (09/20/2021)   Received from [facility]   HITS    Physically Hurt: Not on file    Insult or Talk Down To: Not on file  Threaten Physical Harm: Not on file    Scream or Curse: Not on file     Physical Exam   Vitals:   12/27/23 0325 12/27/23 0330  BP: (!) 169/97 (!) 174/96  Pulse: (!) 56 68  Resp:    Temp:    SpO2: 99% 98%    CONSTITUTIONAL: Well-appearing, NAD NEURO/PSYCH:  Alert and oriented x 3, no focal deficits EYES:  eyes equal and reactive ENT/NECK:  no LAD, no JVD CARDIO: Regular rate, well-perfused, normal S1 and S2 PULM:  CTAB no wheezing or rhonchi GI/GU:  non-distended, non-tender MSK/SPINE:  No gross deformities, no edema SKIN:  no rash, atraumatic   *Additional and/or pertinent findings included in MDM below  Diagnostic and Interventional Summary    EKG Interpretation Date/Time:    Ventricular Rate:    PR Interval:    QRS Duration:    QT Interval:    QTC Calculation:   R Axis:      Text Interpretation:         Labs Reviewed - No data to display  DG  Shoulder Right Portable  Final Result    DG Shoulder Right Portable  Final Result      Medications  propofol  (DIPRIVAN ) 10 mg/mL bolus/IV push 108.4 mg (0 mg Intravenous Hold 12/27/23 0214)  morphine  (PF) 4 MG/ML injection 4 mg (4 mg Intravenous Given 12/27/23 0017)  propofol  (DIPRIVAN ) 10 mg/mL bolus/IV push (50 mg Intravenous Given 12/27/23 0156)  HYDROmorphone  (DILAUDID ) injection 1 mg (1 mg Intravenous Given 12/27/23 0233)     Procedures  /  Critical Care .Sedation  Date/Time: 12/27/2023 2:31 AM  Performed by: Theadore Ozell HERO, MD Authorized by: Theadore Ozell HERO, MD   Consent:    Consent obtained:  Verbal   Consent given by:  Patient   Risks discussed:  Allergic reaction, dysrhythmia, inadequate sedation, nausea, vomiting, prolonged hypoxia resulting in organ damage and respiratory compromise necessitating ventilatory assistance and intubation Universal protocol:    Immediately prior to procedure, a time out was called: yes     Patient identity confirmed:  Arm band Indications:    Procedure performed:  Dislocation reduction   Procedure necessitating sedation performed by:  Physician performing sedation Pre-sedation assessment:    Time since last food or drink:  4 hours   ASA classification: class 1 - normal, healthy patient     Mouth opening:  3 or more finger widths   Mallampati score:  I - soft palate, uvula, fauces, pillars visible   Neck mobility: normal     Pre-sedation assessments completed and reviewed: airway patency, cardiovascular function, hydration status, mental status, nausea/vomiting, pain level, respiratory function and temperature   A pre-sedation assessment was completed prior to the start of the procedure Immediate pre-procedure details:    Reviewed: vital signs and relevant labs/tests     Verified: bag valve mask available, emergency equipment available, intubation equipment available, IV patency confirmed, oxygen available and suction available   Procedure  details (see MAR for exact dosages):    Preoxygenation:  Nasal cannula   Sedation:  Propofol    Intended level of sedation: deep   Analgesia:  Morphine    Intra-procedure monitoring:  Blood pressure monitoring, cardiac monitor, continuous pulse oximetry, continuous capnometry, frequent LOC assessments and frequent vital sign checks   Intra-procedure events: none     Total Provider sedation time (minutes):  25 Post-procedure details:   A post-sedation assessment was completed following the completion of the procedure.   Attendance: Constant attendance  by certified staff until patient recovered     Recovery: Patient returned to pre-procedure baseline     Post-sedation assessments completed and reviewed: airway patency, cardiovascular function, hydration status, mental status, nausea/vomiting, pain level, respiratory function and temperature     Patient is stable for discharge or admission: yes     Procedure completion:  Tolerated well, no immediate complications .Reduction of dislocation  Date/Time: 12/27/2023 2:32 AM  Performed by: Theadore Ozell HERO, MD Authorized by: Theadore Ozell HERO, MD  Consent: Verbal consent obtained Risks and benefits: risks, benefits and alternatives were discussed Consent given by: patient Patient understanding: patient states understanding of the procedure being performed Imaging studies: imaging studies available Patient identity confirmed: verbally with patient Time out: Immediately prior to procedure a time out was called to verify the correct patient, procedure, equipment, support staff and site/side marked as required. Preparation: Patient was prepped and draped in the usual sterile fashion. Local anesthesia used: no  Anesthesia: Local anesthesia used: no  Sedation: Patient sedated: yes  Patient tolerance: patient tolerated the procedure well with no immediate complications Comments: Inferior shoulder dislocation successfully reduced with  traction     ED Course and Medical Decision Making  Initial Impression and Ddx History and exam suspicious for inferior shoulder dislocation.  Limb is neurovascularly intact, no evidence of other trauma to the head or torso.  Past medical/surgical history that increases complexity of ED encounter: None  Interpretation of Diagnostics I personally reviewed the shoulder x-ray and my interpretation is as follows: Dislocation noted  Postreduction film showing anatomical alignment  Patient Reassessment and Ultimate Disposition/Management     Patient has recovered from the sedation, doing well, still some soreness with movement but much improved.  Appropriate for discharge.  Patient management required discussion with the following services or consulting groups:  None  Complexity of Problems Addressed Acute illness or injury that poses threat of life of bodily function  Additional Data Reviewed and Analyzed Further history obtained from: Further history from spouse/family member  Additional Factors Impacting ED Encounter Risk Prescriptions and Major procedures  Ozell HERO. Theadore, MD [facility] Emergency [facility] [email or web address]  Final Clinical Impressions(s) / ED Diagnoses     ICD-10-CM   1. Inferior dislocation of right shoulder, initial encounter  S43.034A       ED Discharge Orders          Ordered    oxyCODONE  (ROXICODONE ) 5 MG immediate release tablet  Every 4 hours PRN        12/27/23 0333             Discharge Instructions Discussed with and Provided to Patient:     Discharge Instructions      You were evaluated in the Emergency Department and after careful evaluation, we did not find any emergent condition requiring admission or further testing in the hospital.  Your exam/testing today is overall reassuring.  Your pain is likely due to a shoulder dislocation which we reduced here in the emergency department.  You may have  some continued soreness related to rotator cuff injury.  Recommend follow-up with the orthopedic specialists.  Use the sling.  Recommend Tylenol  1000 mg every 4-6 hours and/or Motrin  600 mg every 4-6 hours for pain.  You can use the oxycodone  medication for more significant pain.  Please return to the Emergency Department if you experience any worsening of your condition.   Thank you for allowing us  to be a part of your care.  Theadore Ozell HERO, MD 12/27/23 [phone]

## 2023-12-27 NOTE — Discharge Instructions (Addendum)
 You were evaluated in the Emergency Department and after careful evaluation, we did not find any emergent condition requiring admission or further testing in the hospital.  Your exam/testing today is overall reassuring.  Your pain is likely due to a shoulder dislocation which we reduced here in the emergency department.  You may have some continued soreness related to rotator cuff injury.  Recommend follow-up with the orthopedic specialists.  Use the sling.  Recommend Tylenol  1000 mg every 4-6 hours and/or Motrin  600 mg every 4-6 hours for pain.  You can use the oxycodone  medication for more significant pain.  Please return to the Emergency Department if you experience any worsening of your condition.   Thank you for allowing us  to be a part of your care.

## 2024-01-09 ENCOUNTER — Other Ambulatory Visit: Payer: Self-pay | Admitting: Orthopedic Surgery

## 2024-01-09 DIAGNOSIS — M25511 Pain in right shoulder: Secondary | ICD-10-CM

## 2024-01-14 ENCOUNTER — Encounter: Payer: Self-pay | Admitting: Internal Medicine

## 2024-01-18 ENCOUNTER — Encounter: Payer: Self-pay | Admitting: Internal Medicine

## 2024-01-24 ENCOUNTER — Encounter: Payer: Self-pay | Admitting: *Deleted

## 2024-02-10 ENCOUNTER — Ambulatory Visit

## 2024-02-10 VITALS — Ht 71.0 in | Wt 239.0 lb

## 2024-02-10 DIAGNOSIS — Z800 Family history of malignant neoplasm of digestive organs: Secondary | ICD-10-CM

## 2024-02-10 DIAGNOSIS — Z860100 Personal history of colon polyps, unspecified: Secondary | ICD-10-CM

## 2024-02-10 NOTE — Progress Notes (Signed)

## 2024-03-01 NOTE — Progress Notes (Signed)
 [facility] History and Physical   Primary Care Physician:  Severa Rock HERO, FNP   Reason for Procedure:  History of colon polyps  Plan:    Colonoscopy     HPI: Calvin Lin is a 63 y.o. male here for surveillance colonoscopy with a polyp history as outlined below.  08/2010 - 3 diminutive adenomas 10/29/2013 diminutive IC valve polyp snared - adenoma 12/12/2018 diminutive cecal polyp removed - adenoma recall 2025 Past Medical History:  Diagnosis Date   Arthritis    r ankle   Asthma    Blood transfusion without reported diagnosis    Cataract    Personal history of colonic polyps - adenomas 09/13/2010   Sleep apnea     Past Surgical History:  Procedure Laterality Date   ANKLE FRACTURE SURGERY     CARPAL TUNNEL RELEASE     right   COLONOSCOPY     vein repair r flank       Current Outpatient Medications  Medication Sig Dispense Refill   Testosterone  20.25 MG/ACT (1.62%) GEL Apply 1 Application topically daily.     vitamin C (ASCORBIC ACID) 500 MG tablet Take 500 mg by mouth daily.     cetirizine  (ZYRTEC  ALLERGY) 10 MG tablet Take 1 tablet (10 mg total) by mouth daily. (Patient not taking: Reported on 02/10/2024) 90 tablet 1   fluticasone  (FLONASE ) 50 MCG/ACT nasal spray Place 2 sprays into both nostrils daily. (Patient not taking: Reported on 02/10/2024) 16 g 6   Multiple Vitamins-Minerals (ZINC PO) Take by mouth.     oxyCODONE  (ROXICODONE ) 5 MG immediate release tablet Take 1 tablet (5 mg total) by mouth every 4 (four) hours as needed for severe pain (pain score 7-10). 8 tablet 0   No current facility-administered medications for this visit.    Allergies as of 03/02/2024 - Review Complete 03/02/2024  Allergen Reaction Noted   Oxycodone  Nausea Only 01/24/2024   Prednisone  Other (See Comments) 07/24/2020    Family History  Problem Relation Age of Onset   Esophageal cancer Father    Stomach cancer Father    Colon cancer Maternal Uncle    Rectal cancer  Neg Hx     Social History   Socioeconomic History   Marital status: Married    Spouse name: Not on file   Number of children: Not on file   Years of education: Not on file   Highest education level: Not on file  Occupational History   Occupation: POLICE OFFICER    Employer: [employer]  Tobacco Use   Smoking status: Never   Smokeless tobacco: Never  Vaping Use   Vaping status: Never Used  Substance and Sexual Activity   Alcohol use: Yes    Alcohol/week: 1.0 standard drink of alcohol    Types: 1 Cans of beer per week    Comment: occ   Drug use: No   Sexual activity: Not on file  Other Topics Concern   Not on file  Social History Narrative   Not on file   Social Drivers of Health   Financial Resource Strain: Not on file  Food Insecurity: Low Risk  (09/13/2023)   Received from [facility]   Hunger Vital Sign    Within the past 12 months, you worried that your food would run out before you got money to buy more: Never true    Within the past 12 months, the food you bought just didn't last and you didn't have money to get more. :  Never true  Transportation Needs: No Transportation Needs (09/13/2023)   Received from [facility]   Transportation    In the past 12 months, has lack of reliable transportation kept you from medical appointments, meetings, work or from getting things needed for daily living? : No  Physical Activity: Not on file  Stress: Not on file  Social Connections: Unknown (10/30/2021)   Received from [facility]   Social Network    Social Network: Not on file  Intimate Partner Violence: Unknown (09/20/2021)   Received from [facility]   HITS    Physically Hurt: Not on file    Insult or Talk Down To: Not on file    Threaten Physical Harm: Not on file    Scream or Curse: Not on file    Review of Systems:  All other review of systems negative except as mentioned in the HPI.  Physical Exam: Vital signs BP 122/73   Pulse (!) 55   Temp  (!) 97.3 F (36.3 C)   Resp 13   Ht 5' 11 (1.803 m)   Wt 239 lb (108.4 kg)   SpO2 94%   BMI 33.33 kg/m   General:   Alert,  Well-developed, well-nourished, pleasant and cooperative in NAD Lungs:  Clear throughout to auscultation.   Heart:  Regular rate and rhythm; no murmurs, clicks, rubs,  or gallops. Abdomen:  Soft, nontender and nondistended. Normal bowel sounds.   Neuro/Psych:  Alert and cooperative. Normal mood and affect. A and O x 3   @[email or web address]  CHARLENA Commander, MD, [facility] [phone] (pager) 03/02/2024 11:50 AM@

## 2024-03-02 ENCOUNTER — Ambulatory Visit (AMBULATORY_SURGERY_CENTER): Admitting: Internal Medicine

## 2024-03-02 ENCOUNTER — Encounter: Payer: Self-pay | Admitting: Internal Medicine

## 2024-03-02 VITALS — BP 111/76 | HR 62 | Temp 97.3°F | Resp 12 | Ht 71.0 in | Wt 239.0 lb

## 2024-03-02 DIAGNOSIS — K635 Polyp of colon: Secondary | ICD-10-CM | POA: Diagnosis not present

## 2024-03-02 DIAGNOSIS — D122 Benign neoplasm of ascending colon: Secondary | ICD-10-CM

## 2024-03-02 DIAGNOSIS — Z860100 Personal history of colon polyps, unspecified: Secondary | ICD-10-CM

## 2024-03-02 DIAGNOSIS — Z860101 Personal history of adenomatous and serrated colon polyps: Secondary | ICD-10-CM

## 2024-03-02 DIAGNOSIS — Z1211 Encounter for screening for malignant neoplasm of colon: Secondary | ICD-10-CM

## 2024-03-02 MED ORDER — SODIUM CHLORIDE 0.9 % IV SOLN: 500.0000 mL | Freq: Once | INTRAVENOUS | Status: DC

## 2024-03-02 NOTE — Op Note (Signed)
  [facility] Patient Name: Calvin Lin Procedure Date: 03/02/2024 10:48 AM MRN: [id number] Endoscopist: Lupita FORBES Commander , MD, [id number] Age: 63 Referring MD:  Date of Birth: [birth year] Gender: Male Account #: [id number] Procedure:                Colonoscopy Indications:              Surveillance: Personal history of adenomatous                            polyps on last colonoscopy 5 years ago, Last                            colonoscopy: 2020 Medicines:                Monitored Anesthesia Care Procedure:                Pre-Anesthesia Assessment:                           - Prior to the procedure, a History and Physical                            was performed, and patient medications and                            allergies were reviewed. The patient's tolerance of                            previous anesthesia was also reviewed. The risks                            and benefits of the procedure and the sedation                            options and risks were discussed with the patient.                            All questions were answered, and informed consent                            was obtained. Prior Anticoagulants: The patient has                            taken no anticoagulant or antiplatelet agents. ASA                            Grade Assessment: II - A patient with mild systemic                            disease. After reviewing the risks and benefits,                            the patient was deemed in satisfactory condition to  undergo the procedure.                           After obtaining informed consent, the colonoscope                            was passed under direct vision. Throughout the                            procedure, the patient's blood pressure, pulse, and                            oxygen saturations were monitored continuously. The                            CF [id number] #[id number] was introduced through the  anus                            and advanced to the the cecum, identified by                            appendiceal orifice and ileocecal valve. The                            colonoscopy was performed without difficulty. The                            patient tolerated the procedure well. The quality                            of the bowel preparation was adequate. The                            ileocecal valve, appendiceal orifice, and rectum                            were photographed. The bowel preparation used was                            Miralax via split dose instruction. Scope In: 11:11:26 AM Scope Out: 11:31:12 AM Scope Withdrawal Time: 0 hours 14 minutes 15 seconds  Total Procedure Duration: 0 hours 19 minutes 46 seconds  Findings:                 The perianal and digital rectal examinations were                            normal.                           Two sessile polyps were found in the ascending                            colon. The polyps were 3 to 4 mm in size. These  polyps were removed with a cold snare. Resection                            and retrieval were complete. Verification of                            patient identification for the specimen was done.                            Estimated blood loss was minimal.                           The exam was otherwise without abnormality on                            direct and retroflexion views. Complications:            No immediate complications. Estimated Blood Loss:     Estimated blood loss was minimal. Impression:               - Two 3 to 4 mm polyps in the ascending colon,                            removed with a cold snare. Resected and retrieved.                           - The examination was otherwise normal on direct                            and retroflexion views.                           - Personal history of colonic polyps.                           08/2010 - 3  diminutive adenomas                           10/29/2013 diminutive IC valve polyp snared - adenoma                           12/12/2018 diminutive cecal polyp removed - adenoma Recommendation:           - Patient has a contact number available for                            emergencies. The signs and symptoms of potential                            delayed complications were discussed with the                            patient. Return to normal activities tomorrow.  Written discharge instructions were provided to the                            patient.                           - Resume previous diet.                           - Continue present medications.                           - Repeat colonoscopy is recommended for                            surveillance. The colonoscopy date will be                            determined after pathology results from today's                            exam become available for review. Lupita FORBES Commander, MD 03/02/2024 11:38:01 AM This report has been signed electronically.

## 2024-03-02 NOTE — Progress Notes (Signed)
 Called to room to assist during endoscopic procedure.  Patient ID and intended procedure confirmed with present staff. Received instructions for my participation in the procedure from the performing physician.

## 2024-03-02 NOTE — Progress Notes (Signed)
 Pt's states no medical or surgical changes since previsit or office visit.

## 2024-03-02 NOTE — Patient Instructions (Addendum)
 Two tiny polyps removed today.  I will let you know pathology results and when to have another routine colonoscopy by mail and/or My Chart.  I appreciate the opportunity to care for you. Lupita CHARLENA Commander, MD, FACG  YOU HAD AN ENDOSCOPIC PROCEDURE TODAY AT THE [facility]:   Refer to the procedure report that was given to you for any specific questions about what was found during the examination.  If the procedure report does not answer your questions, please call your gastroenterologist to clarify.  If you requested that your care partner not be given the details of your procedure findings, then the procedure report has been included in a sealed envelope for you to review at your convenience later.  YOU SHOULD EXPECT: Some feelings of bloating in the abdomen. Passage of more gas than usual.  Walking can help get rid of the air that was put into your GI tract during the procedure and reduce the bloating. If you had a lower endoscopy (such as a colonoscopy or flexible sigmoidoscopy) you may notice spotting of blood in your stool or on the toilet paper. If you underwent a bowel prep for your procedure, you may not have a normal bowel movement for a few days.  Please Note:  You might notice some irritation and congestion in your nose or some drainage.  This is from the oxygen used during your procedure.  There is no need for concern and it should clear up in a day or so.  SYMPTOMS TO REPORT IMMEDIATELY:  Following lower endoscopy (colonoscopy or flexible sigmoidoscopy):  Excessive amounts of blood in the stool  Significant tenderness or worsening of abdominal pains  Swelling of the abdomen that is new, acute  Fever of 100F or higher   For urgent or emergent issues, a gastroenterologist can be reached at any hour by calling [phone]. Do not use MyChart messaging for urgent concerns.    DIET:  We do recommend a small meal at first, but then you may proceed to your regular diet.   Drink plenty of fluids but you should avoid alcoholic beverages for 24 hours.  ACTIVITY:  You should plan to take it easy for the rest of today and you should NOT DRIVE or use heavy machinery until tomorrow (because of the sedation medicines used during the test).    FOLLOW UP: Our staff will call the number listed on your records the next business day following your procedure.  We will call around 7:15- 8:00 am to check on you and address any questions or concerns that you may have regarding the information given to you following your procedure. If we do not reach you, we will leave a message.     If any biopsies were taken you will be contacted by phone or by letter within the next 1-3 weeks.  Please call us  at [phone] if you have not heard about the biopsies in 3 weeks.    SIGNATURES/CONFIDENTIALITY: You and/or your care partner have signed paperwork which will be entered into your electronic medical record.  These signatures attest to the fact that that the information above on your After Visit Summary has been reviewed and is understood.  Full responsibility of the confidentiality of this discharge information lies with you and/or your care-partner.

## 2024-03-02 NOTE — Progress Notes (Signed)
 Sedate, gd SR, tolerated procedure well, VSS, report to RN

## 2024-03-03 ENCOUNTER — Telehealth: Payer: Self-pay | Admitting: *Deleted

## 2024-03-03 NOTE — Telephone Encounter (Signed)
  Follow up Call-     03/02/2024   10:20 AM  Call back number  Post procedure Call Back phone  # [phone]  Permission to leave phone message Yes     Patient questions:  Do you have a fever, pain , or abdominal swelling? No. Pain Score  0 *  Have you tolerated food without any problems? Yes.    Have you been able to return to your normal activities? Yes.    Do you have any questions about your discharge instructions: Diet   No. Medications  No. Follow up visit  No.  Do you have questions or concerns about your Care? No.  Actions: * If pain score is 4 or above: No action needed, pain <4.

## 2024-03-04 LAB — SURGICAL PATHOLOGY

## 2024-03-06 ENCOUNTER — Ambulatory Visit: Payer: Self-pay | Admitting: Internal Medicine

## 2024-03-06 DIAGNOSIS — Z860100 Personal history of colon polyps, unspecified: Secondary | ICD-10-CM
